# Patient Record
Sex: Male | Born: 1995 | Race: Black or African American | Hispanic: No | Marital: Single | Smoking: Never smoker
Health system: Southern US, Community
[De-identification: ages and names within clinical notes are randomized; demographics above are authoritative.]

## PROBLEM LIST (undated history)

## (undated) DIAGNOSIS — M419 Scoliosis, unspecified: Secondary | ICD-10-CM

## (undated) DIAGNOSIS — G809 Cerebral palsy, unspecified: Secondary | ICD-10-CM

## (undated) HISTORY — PX: BACK SURGERY: SHX140

---
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
## 2015-02-06 ENCOUNTER — Emergency Department (HOSPITAL_COMMUNITY): Payer: 59

## 2015-02-06 ENCOUNTER — Encounter (HOSPITAL_COMMUNITY): Payer: Self-pay | Admitting: Emergency Medicine

## 2015-02-06 ENCOUNTER — Inpatient Hospital Stay (HOSPITAL_COMMUNITY)
Admission: EM | Admit: 2015-02-06 | Discharge: 2015-02-09 | DRG: 689 | Disposition: A | Discharge status: Home / Self Care | Payer: 59 | Attending: Internal Medicine | Admitting: Internal Medicine

## 2015-02-06 DIAGNOSIS — IMO0001 Reserved for inherently not codable concepts without codable children: Secondary | ICD-10-CM

## 2015-02-06 DIAGNOSIS — K5909 Other constipation: Secondary | ICD-10-CM | POA: Diagnosis present

## 2015-02-06 DIAGNOSIS — R109 Unspecified abdominal pain: Secondary | ICD-10-CM | POA: Diagnosis not present

## 2015-02-06 DIAGNOSIS — N390 Urinary tract infection, site not specified: Secondary | ICD-10-CM | POA: Diagnosis not present

## 2015-02-06 DIAGNOSIS — K5900 Constipation, unspecified: Secondary | ICD-10-CM | POA: Diagnosis present

## 2015-02-06 DIAGNOSIS — G809 Cerebral palsy, unspecified: Secondary | ICD-10-CM | POA: Diagnosis not present

## 2015-02-06 DIAGNOSIS — M419 Scoliosis, unspecified: Secondary | ICD-10-CM | POA: Diagnosis not present

## 2015-02-06 DIAGNOSIS — Z993 Dependence on wheelchair: Secondary | ICD-10-CM

## 2015-02-06 DIAGNOSIS — R532 Functional quadriplegia: Secondary | ICD-10-CM | POA: Diagnosis present

## 2015-02-06 HISTORY — DX: Scoliosis, unspecified: M41.9

## 2015-02-06 HISTORY — DX: Cerebral palsy, unspecified: G80.9

## 2015-02-06 LAB — CBC WITH DIFFERENTIAL/PLATELET
Basophils Absolute: 0 10*3/uL (ref 0.0–0.1)
Basophils Relative: 0 %
EOS ABS: 0 10*3/uL (ref 0.0–0.7)
EOS PCT: 0 %
HCT: 45.9 % (ref 39.0–52.0)
HEMOGLOBIN: 15 g/dL (ref 13.0–17.0)
LYMPHS ABS: 1 10*3/uL (ref 0.7–4.0)
LYMPHS PCT: 19 %
MCH: 23.5 pg — AB (ref 26.0–34.0)
MCHC: 32.7 g/dL (ref 30.0–36.0)
MCV: 72.1 fL — AB (ref 78.0–100.0)
MONOS PCT: 7 %
Monocytes Absolute: 0.4 10*3/uL (ref 0.1–1.0)
Neutro Abs: 3.8 10*3/uL (ref 1.7–7.7)
Neutrophils Relative %: 74 %
PLATELETS: 190 10*3/uL (ref 150–400)
RBC: 6.37 MIL/uL — ABNORMAL HIGH (ref 4.22–5.81)
RDW: 14.1 % (ref 11.5–15.5)
WBC: 5.2 10*3/uL (ref 4.0–10.5)

## 2015-02-06 LAB — COMPREHENSIVE METABOLIC PANEL
ALBUMIN: 4.6 g/dL (ref 3.5–5.0)
ALT: 14 U/L — AB (ref 17–63)
AST: 19 U/L (ref 15–41)
Alkaline Phosphatase: 42 U/L (ref 38–126)
Anion gap: 9 (ref 5–15)
BUN: 10 mg/dL (ref 6–20)
CHLORIDE: 103 mmol/L (ref 101–111)
CO2: 29 mmol/L (ref 22–32)
CREATININE: 0.92 mg/dL (ref 0.61–1.24)
Calcium: 9.6 mg/dL (ref 8.9–10.3)
GFR calc Af Amer: >60 mL/min (ref >60)
GFR calc non Af Amer: >60 mL/min (ref >60)
GLUCOSE: 94 mg/dL (ref 65–99)
POTASSIUM: 3.8 mmol/L (ref 3.5–5.1)
SODIUM: 141 mmol/L (ref 135–145)
Total Bilirubin: 0.6 mg/dL (ref 0.3–1.2)
Total Protein: 8 g/dL (ref 6.5–8.1)

## 2015-02-06 LAB — URINALYSIS, ROUTINE W REFLEX MICROSCOPIC
BILIRUBIN URINE: NEGATIVE
GLUCOSE, UA: NEGATIVE mg/dL
Ketones, ur: 15 mg/dL — AB
Leukocytes, UA: NEGATIVE
Nitrite: NEGATIVE
Protein, ur: 30 mg/dL — AB
SPECIFIC GRAVITY, URINE: 1.025 (ref 1.005–1.030)
pH: 7 (ref 5.0–8.0)

## 2015-02-06 LAB — LACTIC ACID, PLASMA
LACTIC ACID, VENOUS: 2.2 mmol/L — AB (ref 0.5–2.0)
LACTIC ACID, VENOUS: 1.4 mmol/L (ref 0.5–2.0)

## 2015-02-06 LAB — URINE MICROSCOPIC-ADD ON

## 2015-02-06 MED ORDER — PIPERACILLIN-TAZOBACTAM 3.375 G IVPB: 3.3750 g | Freq: Three times a day (TID) | INTRAVENOUS | Status: DC

## 2015-02-06 MED ORDER — SODIUM CHLORIDE 0.9 % IV SOLN
INTRAVENOUS | Status: AC
  Administered 2015-02-07: 03:00:00 via INTRAVENOUS

## 2015-02-06 MED ORDER — BACLOFEN 40 MG/20ML IT SOLN
40.0000 mg | INTRATHECAL | Status: DC
Start: 2015-02-06 — End: 2015-02-06

## 2015-02-06 MED ORDER — SODIUM CHLORIDE 0.9 % IV SOLN
INTRAVENOUS | Status: DC
Start: 2015-02-06 — End: 2015-02-06

## 2015-02-06 MED ORDER — VANCOMYCIN HCL IN DEXTROSE 1-5 GM/200ML-% IV SOLN
1000.0000 mg | Freq: Once | INTRAVENOUS | Status: AC
Start: 2015-02-06 — End: 2015-02-06
  Administered 2015-02-06: 1000 mg via INTRAVENOUS
  Filled 2015-02-06: qty 200

## 2015-02-06 MED ORDER — SODIUM CHLORIDE 0.9 % IV SOLN
1000.0000 mL | INTRAVENOUS | Status: DC
  Administered 2015-02-06: 1000 mL via INTRAVENOUS

## 2015-02-06 MED ORDER — SODIUM CHLORIDE 0.9 % IV BOLUS (SEPSIS)
1000.0000 mL | Freq: Once | INTRAVENOUS | Status: AC
  Administered 2015-02-06: 1000 mL via INTRAVENOUS

## 2015-02-06 MED ORDER — PIPERACILLIN-TAZOBACTAM 3.375 G IVPB 30 MIN
3.3750 g | Freq: Once | INTRAVENOUS | Status: AC
Start: 2015-02-06 — End: 2015-02-06
  Administered 2015-02-06: 3.375 g via INTRAVENOUS
  Filled 2015-02-06: qty 50

## 2015-02-06 MED ORDER — HYDROCODONE-ACETAMINOPHEN 5-325 MG PO TABS
1.0000 | ORAL_TABLET | ORAL | Status: DC | PRN
  Administered 2015-02-07: 2 via ORAL
  Filled 2015-02-06: qty 2

## 2015-02-06 MED ORDER — SODIUM CHLORIDE 0.9 % IV BOLUS (SEPSIS)
500.0000 mL | INTRAVENOUS | Status: AC
  Administered 2015-02-06: 500 mL via INTRAVENOUS

## 2015-02-06 MED ORDER — ONDANSETRON HCL 4 MG PO TABS: 4.0000 mg | ORAL_TABLET | Freq: Four times a day (QID) | ORAL | Status: DC | PRN

## 2015-02-06 MED ORDER — SODIUM CHLORIDE 0.9 % IV SOLN
500.0000 mg | Freq: Three times a day (TID) | INTRAVENOUS | Status: DC
  Filled 2015-02-06 (×2): qty 500

## 2015-02-06 MED ORDER — ALUM & MAG HYDROXIDE-SIMETH 200-200-20 MG/5ML PO SUSP: 30.0000 mL | Freq: Four times a day (QID) | ORAL | Status: DC | PRN

## 2015-02-06 MED ORDER — ONDANSETRON HCL 4 MG/2ML IJ SOLN: 4.0000 mg | Freq: Four times a day (QID) | INTRAMUSCULAR | Status: DC | PRN

## 2015-02-06 MED ORDER — CEFTRIAXONE SODIUM 1 G IJ SOLR
1.0000 g | INTRAMUSCULAR | Status: DC
  Administered 2015-02-07 – 2015-02-09 (×3): 1 g via INTRAVENOUS
  Filled 2015-02-06 (×4): qty 10

## 2015-02-06 NOTE — Progress Notes (Signed)
Pharmacy Antibiotic Note  Caleb Sullivan is a 20 y.o. male admitted on 02/06/2015 with sepsis.  Pharmacy has been consulted for Vancomycin and Zosyn dosing.  Plan: Vancomycin 500 IV every 8 hours.  Goal trough 15-20 mcg/mL. Zosyn 3.375g IV q8h (4 hour infusion).  Height:  (160 cm) Weight: 110 lb (49.896 kg) IBW/kg (Calculated) : 56.9  Temp (24hrs), Avg:100.2 F (37.9 C), Min:99.5 F (37.5 C), Max:100.9 F (38.3 C)   Recent Labs Lab 02/06/15 1755  WBC 5.2  CREATININE 0.92  LATICACIDVEN 2.2*    Estimated Creatinine Clearance: 89.6 mL/min (by C-G formula based on Cr of 0.92).    Allergies  Allergen Reactions  . Fish Allergy    Anti-infectives    Start     Dose/Rate Route Frequency Ordered Stop   02/07/15 0600  vancomycin (VANCOCIN) 500 mg in sodium chloride 0.9 % 100 mL IVPB     500 mg 100 mL/hr over 60 Minutes Intravenous Every 8 hours 02/06/15 2129     02/07/15 0400  piperacillin-tazobactam (ZOSYN) IVPB 3.375 g     3.375 g 12.5 mL/hr over 240 Minutes Intravenous Every 8 hours 02/06/15 2128     02/06/15 1645  piperacillin-tazobactam (ZOSYN) IVPB 3.375 g     3.375 g 100 mL/hr over 30 Minutes Intravenous  Once 02/06/15 1633 02/06/15 1948   02/06/15 1645  vancomycin (VANCOCIN) IVPB 1000 mg/200 mL premix     1,000 mg 200 mL/hr over 60 Minutes Intravenous  Once 02/06/15 1633 02/06/15 2113     Thank you for allowing pharmacy to be a part of this patient's care.  Valrie Hart A 02/06/2015 9:30 PM

## 2015-02-06 NOTE — ED Notes (Signed)
Having difficulty starting IV, attempted x3.  Second nurse in to attempt.

## 2015-02-06 NOTE — ED Notes (Signed)
Pt with cerebral palsy, having urinary retention this morning and holding lower abdomen.  Pt unable to communicate verbally.

## 2015-02-06 NOTE — ED Provider Notes (Signed)
CSN: [id number]     Arrival date & time 02/06/15  1606 History   First MD Initiated Contact with Patient 02/06/15 1618     Chief Complaint  Patient presents with  . Urinary Retention     (Consider location/radiation/quality/duration/timing/severity/associated sxs/prior Treatment) HPI Comments: Patient with history of cerebral palsy, wheelchair bound presenting with what the caregiver thinks is abdominal pain since this morning. Guardian states patient was holding his abdomen as he was breathing him. He has a baclofen pump that was placed in September. Denies any fever but is febrile here. States he's been moving his bowels every other day which is normal. No vomiting. Good by mouth intake and urine output. He does not catheterize himself. He is normally on his own. Behavior has been normal. No history of similar problems in the past.   Past Medical History  Diagnosis Date  . Cerebral palsy (HCC)   . Scoliosis    Past Surgical History  Procedure Laterality Date  . Back surgery     History reviewed. No pertinent family history. Social History  Substance Use Topics  . Smoking status: Never Smoker   . Smokeless tobacco: None  . Alcohol Use: No    Review of Systems  Unable to perform ROS: Patient nonverbal      Allergies  Fish allergy  Home Medications   Prior to Admission medications   Medication Sig Start Date End Date Taking? Authorizing Provider  baclofen (LIORESAL) 40 MG/20ML SOLN 40 mg by Intrathecal route See admin instructions.   Yes Historical Provider, MD  glycopyrrolate (ROBINUL) 1 MG tablet 1 mg by Gastric Tube route 3 (three) times daily.    Yes Historical Provider, MD  loratadine (ALAVERT) 10 MG dissolvable tablet 10 mg by Gastric Tube route at bedtime.    Yes Historical Provider, MD  Nebulizers MISC by Does not apply route as needed.   Yes Historical Provider, MD   BP 136/78 mmHg  Pulse 106  Temp(Src) 99.4 F (37.4 C) (Oral)  Resp 16  Ht  (1.6 m)   Wt 93 lb 4.1 oz (42.3 kg)  BMI 16.52 kg/m2  SpO2 99% Physical Exam  Constitutional: He appears well-developed and well-nourished. No distress.  HENT:  Head: Normocephalic and atraumatic.  Mouth/Throat: Oropharynx is clear and moist.  Eyes: Conjunctivae and EOM are normal. Pupils are equal, round, and reactive to light.  Neck: Normal range of motion. Neck supple.  Cardiovascular: Normal rate, regular rhythm and normal heart sounds.   Pulmonary/Chest: Effort normal and breath sounds normal. No respiratory distress.  Abdominal: Soft. There is no tenderness. There is no rebound and no guarding.  Palpable baclofen pump. Abdomen soft, no guarding or rebound  Genitourinary:  Chaperone present. No fecal impaction. Testicles nontender  Musculoskeletal:  Contractures of lower extremities  Neurological:  Nonverbal, does not follow commands    ED Course  Procedures (including critical care time) Labs Review Labs Reviewed  COMPREHENSIVE METABOLIC PANEL - Abnormal; Notable for the following:    ALT 14 (*)    All other components within normal limits  CBC WITH DIFFERENTIAL/PLATELET - Abnormal; Notable for the following:    RBC 6.37 (*)    MCV 72.1 (*)    MCH 23.5 (*)    All other components within normal limits  URINALYSIS, ROUTINE W REFLEX MICROSCOPIC (NOT AT [facility]) - Abnormal; Notable for the following:    Hgb urine dipstick SMALL (*)    Ketones, ur 15 (*)    Protein, ur 30 (*)  All other components within normal limits  LACTIC ACID, PLASMA - Abnormal; Notable for the following:    Lactic Acid, Venous 2.2 (*)    All other components within normal limits  URINE MICROSCOPIC-ADD ON - Abnormal; Notable for the following:    Squamous Epithelial / LPF 0-5 (*)    Bacteria, UA MANY (*)    All other components within normal limits  CULTURE, BLOOD (ROUTINE X 2)  CULTURE, BLOOD (ROUTINE X 2)  URINE CULTURE  LACTIC ACID, PLASMA  BASIC METABOLIC PANEL  CBC    Imaging Review Dg Abd  Acute W/chest  02/06/2015  CLINICAL DATA:  Code sepsis, cerebral palsy, urinary retention, holding lower abdomen, low grade fever EXAM: DG ABDOMEN ACUTE W/ 1V CHEST COMPARISON:  None FINDINGS: Spinal fixation rods thoracic and lumbar spine into sacrum. Normal heart size, mediastinal contours and pulmonary vascularity. Lungs clear. No pleural effusion or pneumothorax. Medication pump RIGHT lower quadrant with intraspinal catheter. Gas and stool throughout mildly distended colon. Small bowel gas pattern normal. Overall bowel gas pattern is nonspecific without definite obstruction, wall thickening or free air. No acute osseous findings. Questionable tiny nonobstructing LEFT renal calculus. IMPRESSION: Clear lungs. Nonspecific bowel gas pattern. Cannot exclude tiny nonobstructing LEFT renal calculus. Electronically Signed   By: Ulyses Southward M.D.   On: 02/06/2015 17:28   Ct Renal Stone Study  02/06/2015  CLINICAL DATA:  Urinary retention and lower abdominal discomfort. Cerebral palsy. Low-grade fever. Possible left renal calculus on CT. EXAM: CT ABDOMEN AND PELVIS WITHOUT CONTRAST TECHNIQUE: Multidetector CT imaging of the abdomen and pelvis was performed following the standard protocol without IV contrast. COMPARISON:  Plain film of earlier today. FINDINGS: Lower chest: Clear lung bases. Normal heart size without pericardial or pleural effusion. Hepatobiliary: Moderate to marked degradation, secondary to combination of extensive posterior spinal hardware and anterior battery pack. Grossly normal liver. Gallbladder not well evaluated. Pancreas: Pancreas not well evaluated. Spleen: Grossly normal spleen. Adrenals/Urinary Tract: Right adrenal gland grossly normal. Left adrenal gland not well visualized. No dominant renal calculi or hydronephrosis. Ureters difficult to follow. No suspicious pelvic calcifications identified. The bladder is moderately distended, without cause identified. Stomach/Bowel: Grossly normal  stomach. Large amount of stool and gas throughout the colon. The appendix is likely identified on image 50/series 2 and is normal. Small bowel not well evaluated. Vascular/Lymphatic: Abdominal aortic grossly normal in caliber. Limited evaluation for abdominal adenopathy. No pelvic sidewall adenopathy. Reproductive: Normal prostate. Other: No significant free fluid. Musculoskeletal: Thoracolumbar spine fixation, without acute hardware complication. IMPRESSION: 1. Moderate to severely degraded exam, secondary to beam hardening artifact from spinal hardware and spinal stimulator pump/battery. 2. No gross urinary tract calculi or hydronephrosis. 3. Moderate bladder distention, of indeterminate etiology. 4. Large amount of colonic stool, suggesting constipation. Electronically Signed   By: Jeronimo Greaves M.D.   On: 02/06/2015 18:48   I have personally reviewed and evaluated these images and lab results as part of my medical decision-making.   EKG Interpretation None      MDM   Final diagnoses:  Abdominal pain  Urinary tract infection without hematuria, site unspecified   Cerebral palsy patient with apparent abdominal pain. He is febrile on arrival. Abdomen is soft with baclofen pump. There is no fecal impaction. Code sepsis given his tachycardia and fever.  AAS without obstruction, possible L renal calculus. No fecal impaction. Lactate 2.2.  WBC normal.    IVF and IV antibiotics per sepsis protocol. UA appears to be dirty.  CT  degraded by artifact.  Distended bladder and constipation.  Patient able to urinate.  Hold foley at this time.  Plan admission for IV antibiotics given His medical history.  D/w Dr. Onalee Hua.   Angiocath insertion Performed by: Glynn Octave  Consent: Verbal consent obtained. Risks and benefits: risks, benefits and alternatives were discussed Time out: Immediately prior to procedure a "time out" was called to verify the correct patient, procedure, equipment, support  staff and site/side marked as required.  Preparation: Patient was prepped and draped in the usual sterile fashion.  Vein Location: L forearm  Yes Ultrasound Guided  Gauge: 20  Normal blood return and flush without difficulty Patient tolerance: Patient tolerated the procedure well with no immediate complications.     Glynn Octave, MD 02/07/15 [phone]

## 2015-02-06 NOTE — H&P (Signed)
PCP:   No primary care provider on file.   Chief Complaint:  Acts like he is in pain  HPI: 20 yo male h/o cerebral palsy wheel chair bound, nonverbal overall happy usually brought in by uncle and grandparents who take care of him because today he was acting like he was in pain and holding his lower abdomen.  He has not had any n/v/d.  No fevers at home.  Has been eating well.  He can bare weight to transfer but today he didn't want to do that as if it hurt and he would hold his lower belly/private area.  They have not noticed any bloody urine, no foul smell to urine.  He is not frequently hosptitalized and not on antibiotics a lot.  Found to have a uti.    Review of Systems:  Positive and negative as per HPI otherwise all other systems are negative per family  Past Medical History: Past Medical History  Diagnosis Date  . Cerebral palsy (HCC)   . Scoliosis    Past Surgical History  Procedure Laterality Date  . Back surgery      Medications: Prior to Admission medications   Medication Sig Start Date End Date Taking? Authorizing Provider  baclofen (LIORESAL) 40 MG/20ML SOLN 40 mg by Intrathecal route See admin instructions.   Yes Historical Provider, MD  glycopyrrolate (ROBINUL) 1 MG tablet 1 mg by Gastric Tube route 3 (three) times daily.    Yes Historical Provider, MD  loratadine (ALAVERT) 10 MG dissolvable tablet 10 mg by Gastric Tube route at bedtime.    Yes Historical Provider, MD  Nebulizers MISC by Does not apply route as needed.   Yes Historical Provider, MD    Allergies:   Allergies  Allergen Reactions  . Fish Allergy     Social History:  reports that he has never smoked. He does not have any smokeless tobacco history on file. He reports that he does not drink alcohol or use illicit drugs.  Family History: No premature CAD  Physical Exam: Filed Vitals:   02/06/15 2011 02/06/15 2030 02/06/15 2130 02/06/15 2221  BP: 135/87 129/83 136/88 136/78  Pulse: 107 104  101 106  Temp:    99.4 F (37.4 C)  TempSrc:    Oral  Resp: Height:      Weight:    42.3 kg (93 lb 4.1 oz)  SpO2: 98% 97% 100% 99%   General appearance: alert, cooperative and no distress Head: Normocephalic, without obvious abnormality, atraumatic Nose: Nares normal. Septum midline. Mucosa normal. No drainage or sinus tenderness. Neck: no JVD and supple, symmetrical, trachea midline Lungs: clear to auscultation bilaterally Heart: regular rate and rhythm, S1, S2 normal, no murmur, click, rub or gallop Abdomen: soft, non-tender; bowel sounds normal; no masses,  no organomegaly Male genitalia: normal Extremities: extremities normal, atraumatic, no cyanosis or edema  Atrophy to ble Pulses: 2+ and symmetric Skin: Skin color, texture, turgor normal. No rashes or lesions Neurologic: Mental status: alertness: alert Cranial nerves: normal   Labs on Admission:   Recent Labs  02/06/15 1755  NA 141  K 3.8  CL 103  CO2 29  GLUCOSE 94  BUN 10  CREATININE 0.92  CALCIUM 9.6    Recent Labs  02/06/15 1755  AST 19  ALT 14*  ALKPHOS 42  BILITOT 0.6  PROT 8.0  ALBUMIN 4.6     Recent Labs  02/06/15 1755  WBC 5.2  NEUTROABS 3.8  HGB  15.0  HCT 45.9  MCV 72.1*  PLT 190    Radiological Exams on Admission: Dg Abd Acute W/chest  02/06/2015  CLINICAL DATA:  Code sepsis, cerebral palsy, urinary retention, holding lower abdomen, low grade fever EXAM: DG ABDOMEN ACUTE W/ 1V CHEST COMPARISON:  None FINDINGS: Spinal fixation rods thoracic and lumbar spine into sacrum. Normal heart size, mediastinal contours and pulmonary vascularity. Lungs clear. No pleural effusion or pneumothorax. Medication pump RIGHT lower quadrant with intraspinal catheter. Gas and stool throughout mildly distended colon. Small bowel gas pattern normal. Overall bowel gas pattern is nonspecific without definite obstruction, wall thickening or free air. No acute osseous findings. Questionable tiny  nonobstructing LEFT renal calculus. IMPRESSION: Clear lungs. Nonspecific bowel gas pattern. Cannot exclude tiny nonobstructing LEFT renal calculus. Electronically Signed   By: Ulyses Southward M.D.   On: 02/06/2015 17:28   Ct Renal Stone Study  02/06/2015  CLINICAL DATA:  Urinary retention and lower abdominal discomfort. Cerebral palsy. Low-grade fever. Possible left renal calculus on CT. EXAM: CT ABDOMEN AND PELVIS WITHOUT CONTRAST TECHNIQUE: Multidetector CT imaging of the abdomen and pelvis was performed following the standard protocol without IV contrast. COMPARISON:  Plain film of earlier today. FINDINGS: Lower chest: Clear lung bases. Normal heart size without pericardial or pleural effusion. Hepatobiliary: Moderate to marked degradation, secondary to combination of extensive posterior spinal hardware and anterior battery pack. Grossly normal liver. Gallbladder not well evaluated. Pancreas: Pancreas not well evaluated. Spleen: Grossly normal spleen. Adrenals/Urinary Tract: Right adrenal gland grossly normal. Left adrenal gland not well visualized. No dominant renal calculi or hydronephrosis. Ureters difficult to follow. No suspicious pelvic calcifications identified. The bladder is moderately distended, without cause identified. Stomach/Bowel: Grossly normal stomach. Large amount of stool and gas throughout the colon. The appendix is likely identified on image 50/series 2 and is normal. Small bowel not well evaluated. Vascular/Lymphatic: Abdominal aortic grossly normal in caliber. Limited evaluation for abdominal adenopathy. No pelvic sidewall adenopathy. Reproductive: Normal prostate. Other: No significant free fluid. Musculoskeletal: Thoracolumbar spine fixation, without acute hardware complication. IMPRESSION: 1. Moderate to severely degraded exam, secondary to beam hardening artifact from spinal hardware and spinal stimulator pump/battery. 2. No gross urinary tract calculi or hydronephrosis. 3. Moderate  bladder distention, of indeterminate etiology. 4. Large amount of colonic stool, suggesting constipation. Electronically Signed   By: Jeronimo Greaves M.D.   On: 02/06/2015 18:48    Assessment/Plan  20 yo male with cerebral palsy and uti  Principal Problem:   Urinary tract infection-  Place on iv rocephin.  Urine cx sent.  Unclear if has stone on ct scan due to metal for prev scoliosis surgeries.  Has good uop.  Follow clinically.  Obtain renal ultrasound.  Active Problems:   Cerebral palsy (HCC)   Scoliosis  obs on medical.  Full code.  Family updated at bedside.  Caleb Sullivan A 02/06/2015, 11:37 PM

## 2015-02-06 NOTE — Progress Notes (Signed)
ANTIBIOTIC CONSULT NOTE-Preliminary  Pharmacy Consult for Vancomycin and Zosyn Indication: sepsis  Allergies  Allergen Reactions  . Fish Allergy    Patient Measurements: Height:  (160 cm) Weight: 110 lb (49.896 kg) IBW/kg (Calculated) : 56.9  Vital Signs: Temp: 100.9 F (38.3 C) (01/30 1610) Temp Source: Tympanic (01/30 1610) BP: 149/106 mmHg (01/30 1610) Pulse Rate: 119 (01/30 1610)  Labs: No results for input(s): WBC, HGB, PLT, LABCREA, CREATININE in the last 72 hours.  CrCl cannot be calculated (Patient has no serum creatinine result on file.).  No results for input(s): VANCOTROUGH, VANCOPEAK, VANCORANDOM, GENTTROUGH, GENTPEAK, GENTRANDOM, TOBRATROUGH, TOBRAPEAK, TOBRARND, AMIKACINPEAK, AMIKACINTROU, AMIKACIN in the last 72 hours.   Microbiology: No results found for this or any previous visit (from the past 720 hour(s)).  Medical History: Past Medical History  Diagnosis Date  . Cerebral palsy (HCC)   . Scoliosis    Anti-infectives    Start     Dose/Rate Route Frequency Ordered Stop   02/06/15 1645  piperacillin-tazobactam (ZOSYN) IVPB 3.375 g     3.375 g 100 mL/hr over 30 Minutes Intravenous  Once 02/06/15 1633     02/06/15 1645  vancomycin (VANCOCIN) IVPB 1000 mg/200 mL premix     1,000 mg 200 mL/hr over 60 Minutes Intravenous  Once 02/06/15 1633       Assessment: 20yo male with h/o cerebral palsy.  Labs pending.    Goal of Therapy:  Vancomycin trough level 15-20 mcg/ml  Plan:  Preliminary review of pertinent patient information completed.  Protocol will be initiated with a one-time dose(s) of Vancomycin  and Zosyn 3.375gm.  Jeani Hawking clinical pharmacist will complete review after admission to assess patient and finalize treatment regimen.  Valrie Hart A, RPH 02/06/2015,4:54 PM

## 2015-02-06 NOTE — ED Notes (Signed)
MD at bedside. 

## 2015-02-06 NOTE — ED Notes (Signed)
CRITICAL VALUE ALERT  Critical value received:  Lactic acid  Date of notification:  02/06/15  Time of notification:  1843  Critical value read back:Yes.    Nurse who received alert:  Dorris Fetch, RN  MD notified (1st page):  Dr Manus Gunning  Time of first page:  [phone]

## 2015-02-06 NOTE — ED Notes (Signed)
Only able to obtain one IV site, MD aware.

## 2015-02-06 NOTE — ED Notes (Signed)
Pt very difficult stick.  Blood cultures drawn x2.

## 2015-02-07 ENCOUNTER — Observation Stay (HOSPITAL_COMMUNITY): Payer: 59

## 2015-02-07 DIAGNOSIS — N3000 Acute cystitis without hematuria: Secondary | ICD-10-CM | POA: Diagnosis not present

## 2015-02-07 DIAGNOSIS — K5900 Constipation, unspecified: Secondary | ICD-10-CM | POA: Diagnosis not present

## 2015-02-07 DIAGNOSIS — G800 Spastic quadriplegic cerebral palsy: Secondary | ICD-10-CM | POA: Diagnosis not present

## 2015-02-07 DIAGNOSIS — Z993 Dependence on wheelchair: Secondary | ICD-10-CM | POA: Diagnosis not present

## 2015-02-07 DIAGNOSIS — R109 Unspecified abdominal pain: Secondary | ICD-10-CM | POA: Diagnosis present

## 2015-02-07 DIAGNOSIS — M419 Scoliosis, unspecified: Secondary | ICD-10-CM | POA: Diagnosis present

## 2015-02-07 DIAGNOSIS — R532 Functional quadriplegia: Secondary | ICD-10-CM | POA: Diagnosis present

## 2015-02-07 DIAGNOSIS — G809 Cerebral palsy, unspecified: Secondary | ICD-10-CM | POA: Diagnosis present

## 2015-02-07 DIAGNOSIS — K5909 Other constipation: Secondary | ICD-10-CM | POA: Diagnosis present

## 2015-02-07 DIAGNOSIS — N390 Urinary tract infection, site not specified: Secondary | ICD-10-CM | POA: Diagnosis present

## 2015-02-07 LAB — BASIC METABOLIC PANEL
Anion gap: 11 (ref 5–15)
BUN: 6 mg/dL (ref 6–20)
CHLORIDE: 102 mmol/L (ref 101–111)
CO2: 24 mmol/L (ref 22–32)
CREATININE: 0.84 mg/dL (ref 0.61–1.24)
Calcium: 9.2 mg/dL (ref 8.9–10.3)
GFR calc Af Amer: >60 mL/min (ref >60)
GFR calc non Af Amer: >60 mL/min (ref >60)
Glucose, Bld: 101 mg/dL — ABNORMAL HIGH (ref 65–99)
Potassium: 4 mmol/L (ref 3.5–5.1)
SODIUM: 137 mmol/L (ref 135–145)

## 2015-02-07 LAB — CBC
HEMATOCRIT: 44.4 % (ref 39.0–52.0)
HEMOGLOBIN: 14.5 g/dL (ref 13.0–17.0)
MCH: 23.4 pg — AB (ref 26.0–34.0)
MCHC: 32.7 g/dL (ref 30.0–36.0)
MCV: 71.7 fL — AB (ref 78.0–100.0)
Platelets: 181 10*3/uL (ref 150–400)
RBC: 6.19 MIL/uL — AB (ref 4.22–5.81)
RDW: 14.1 % (ref 11.5–15.5)
WBC: 6.2 10*3/uL (ref 4.0–10.5)

## 2015-02-07 MED ORDER — BISACODYL 10 MG RE SUPP
10.0000 mg | Freq: Once | RECTAL | Status: AC
  Administered 2015-02-07: 10 mg via RECTAL
  Filled 2015-02-07: qty 1

## 2015-02-07 MED ORDER — CEFTRIAXONE SODIUM 1 G IJ SOLR
INTRAMUSCULAR | Status: AC
  Filled 2015-02-07: qty 10

## 2015-02-07 MED ORDER — ENOXAPARIN SODIUM 40 MG/0.4ML ~~LOC~~ SOLN
40.0000 mg | SUBCUTANEOUS | Status: DC
  Administered 2015-02-07: 40 mg via SUBCUTANEOUS
  Filled 2015-02-07: qty 0.4

## 2015-02-07 MED ORDER — SENNOSIDES-DOCUSATE SODIUM 8.6-50 MG PO TABS
1.0000 | ORAL_TABLET | Freq: Two times a day (BID) | ORAL | Status: DC
  Administered 2015-02-07 – 2015-02-09 (×4): 1 via ORAL
  Filled 2015-02-07 (×5): qty 1

## 2015-02-07 MED ORDER — POLYETHYLENE GLYCOL 3350 17 G PO PACK
17.0000 g | PACK | Freq: Two times a day (BID) | ORAL | Status: DC
  Administered 2015-02-07 – 2015-02-09 (×4): 17 g via ORAL
  Filled 2015-02-07 (×5): qty 1

## 2015-02-07 MED ORDER — FLEET ENEMA 7-19 GM/118ML RE ENEM
1.0000 | ENEMA | Freq: Once | RECTAL | Status: AC
  Administered 2015-02-07: 1 via RECTAL

## 2015-02-07 MED ORDER — ENOXAPARIN SODIUM 30 MG/0.3ML ~~LOC~~ SOLN
30.0000 mg | SUBCUTANEOUS | Status: DC
  Filled 2015-02-07: qty 0.3

## 2015-02-07 NOTE — Plan of Care (Signed)
Problem: Education: Goal: Knowledge of treatment and prevention of UTI/Pyleonephritis will improve Outcome: Progressing Pt's family spoke with MD about diagnosis. MD told pt's family the plan for care, and pt's family verbalized understanding.   Problem: Education: Goal: Knowledge of [facility] Education information/materials will improve Outcome: Progressing Pt unable to understand. Explained care to family who verbalized understanding.   Problem: Pain Managment: Goal: General experience of comfort will improve Outcome: Progressing Pt stated he was hurting and pointed to the right leg. Pt given pain medication.  Pt asleep after reassessing.   Problem: Physical Regulation: Goal: Ability to maintain clinical measurements within normal limits will improve Outcome: Progressing See flowsheet and lab results. Goal: Will remain free from infection Outcome: Not Progressing Dx UTI.

## 2015-02-07 NOTE — Progress Notes (Signed)
[facility] PROGRESS NOTE  Caleb Sullivan ZOX:[id number] DOB: 04-Sep-1995 DOA: 02/06/2015 PCP: No primary care provider on file.  Assessment/Plan: 1. Abd pain/discomfort -due to possible UTI, although UA not convincing and chronic constipation -improving and at baseline per family  2. Possible UTi -FU Urine Cx, continue ceftriaxone -initially there was a concern for urinary retention, but able to void without difficulty at this time  3. ACute on chronic Constipation -per Uncle he gets Enemas almost every other day, no results with enema last Pm -CT with large stool burden in colon -add senokot/miralax, give suppository this am and enema if no results by afternoon  4. Cerebral palsy/Wheel chair bound -total care, cared for at home by uncle and aunt  Code Status: Full Code Family Communication: grandmother and uncle at bedside Disposition Plan: home likely tomorrow  HPI/Subjective: Looks better, no distress, had enema last Pm without results per uncle  Objective: Filed Vitals:   02/06/15 2130 02/06/15 2221  BP: 136/88 136/78  Pulse: 101 106  Temp:  99.4 F (37.4 C)  Resp: 17 16    Intake/Output Summary (Last 24 hours) at 02/07/15 1123 Last data filed at 02/07/15 0700  Gross per 24 hour  Intake  662.5 ml  Output    864 ml  Net -201.5 ml   Filed Weights   02/06/15 1610 02/06/15 2221  Weight: 49.896 kg (110 lb) 42.3 kg (93 lb 4.1 oz)    Exam:   General:  Smiling, mumbling, pleasant, no distress  Cardiovascular: S1S2/RRR  Respiratory: CTAB  Abdomen: soft, NT, BS present  Musculoskeletal: contractures of both lower ext  Data Reviewed: Basic Metabolic Panel:  Recent Labs Lab 02/06/15 1755 02/07/15 0758  NA 141 137  K 3.8 4.0  CL 103 102  CO2 29 24  GLUCOSE 94 101*  BUN 10 6  CREATININE 0.92 0.84  CALCIUM 9.6 9.2   Liver Function Tests:  Recent Labs Lab 02/06/15 1755  AST 19  ALT 14*  ALKPHOS 42  BILITOT 0.6  PROT 8.0  ALBUMIN 4.6    No results for input(s): LIPASE, AMYLASE in the last 168 hours. No results for input(s): AMMONIA in the last 168 hours. CBC:  Recent Labs Lab 02/06/15 1755 02/07/15 0758  WBC 5.2 6.2  NEUTROABS 3.8  --   HGB 15.0 14.5  HCT 45.9 44.4  MCV 72.1* 71.7*  PLT 190 181   Cardiac Enzymes: No results for input(s): CKTOTAL, CKMB, CKMBINDEX, TROPONINI in the last 168 hours. BNP (last 3 results) No results for input(s): BNP in the last 8760 hours.  ProBNP (last 3 results) No results for input(s): PROBNP in the last 8760 hours.  CBG: No results for input(s): GLUCAP in the last 168 hours.  Recent Results (from the past 240 hour(s))  Blood Culture (routine x 2)     Status: None (Preliminary result)   Collection Time: 02/06/15  5:05 PM  Result Value Ref Range Status   Specimen Description BLOOD LEFT HAND  Final   Special Requests BOTTLES DRAWN AEROBIC ONLY 4CC  Final   Culture NO GROWTH < 24 HOURS  Final   Report Status PENDING  Incomplete  Blood Culture (routine x 2)     Status: None (Preliminary result)   Collection Time: 02/06/15  5:30 PM  Result Value Ref Range Status   Specimen Description BLOOD LEFT ANTECUBITAL DRAWN BY RN  Final   Special Requests   Final    BOTTLES DRAWN AEROBIC AND ANAEROBIC AEB=10CC ANA=6CC  Culture NO GROWTH < 24 HOURS  Final   Report Status PENDING  Incomplete     Studies: Dg Abd Acute W/chest  02/06/2015  CLINICAL DATA:  Code sepsis, cerebral palsy, urinary retention, holding lower abdomen, low grade fever EXAM: DG ABDOMEN ACUTE W/ 1V CHEST COMPARISON:  None FINDINGS: Spinal fixation rods thoracic and lumbar spine into sacrum. Normal heart size, mediastinal contours and pulmonary vascularity. Lungs clear. No pleural effusion or pneumothorax. Medication pump RIGHT lower quadrant with intraspinal catheter. Gas and stool throughout mildly distended colon. Small bowel gas pattern normal. Overall bowel gas pattern is nonspecific without definite  obstruction, wall thickening or free air. No acute osseous findings. Questionable tiny nonobstructing LEFT renal calculus. IMPRESSION: Clear lungs. Nonspecific bowel gas pattern. Cannot exclude tiny nonobstructing LEFT renal calculus. Electronically Signed   By: Ulyses Southward M.D.   On: 02/06/2015 17:28   Ct Renal Stone Study  02/06/2015  CLINICAL DATA:  Urinary retention and lower abdominal discomfort. Cerebral palsy. Low-grade fever. Possible left renal calculus on CT. EXAM: CT ABDOMEN AND PELVIS WITHOUT CONTRAST TECHNIQUE: Multidetector CT imaging of the abdomen and pelvis was performed following the standard protocol without IV contrast. COMPARISON:  Plain film of earlier today. FINDINGS: Lower chest: Clear lung bases. Normal heart size without pericardial or pleural effusion. Hepatobiliary: Moderate to marked degradation, secondary to combination of extensive posterior spinal hardware and anterior battery pack. Grossly normal liver. Gallbladder not well evaluated. Pancreas: Pancreas not well evaluated. Spleen: Grossly normal spleen. Adrenals/Urinary Tract: Right adrenal gland grossly normal. Left adrenal gland not well visualized. No dominant renal calculi or hydronephrosis. Ureters difficult to follow. No suspicious pelvic calcifications identified. The bladder is moderately distended, without cause identified. Stomach/Bowel: Grossly normal stomach. Large amount of stool and gas throughout the colon. The appendix is likely identified on image 50/series 2 and is normal. Small bowel not well evaluated. Vascular/Lymphatic: Abdominal aortic grossly normal in caliber. Limited evaluation for abdominal adenopathy. No pelvic sidewall adenopathy. Reproductive: Normal prostate. Other: No significant free fluid. Musculoskeletal: Thoracolumbar spine fixation, without acute hardware complication. IMPRESSION: 1. Moderate to severely degraded exam, secondary to beam hardening artifact from spinal hardware and spinal  stimulator pump/battery. 2. No gross urinary tract calculi or hydronephrosis. 3. Moderate bladder distention, of indeterminate etiology. 4. Large amount of colonic stool, suggesting constipation. Electronically Signed   By: Jeronimo Greaves M.D.   On: 02/06/2015 18:48    Scheduled Meds: . cefTRIAXone (ROCEPHIN)  IV  1 g Intravenous Q24H  . polyethylene glycol  17 g Oral BID  . senna-docusate  1 tablet Oral BID  . sodium phosphate  1 enema Rectal Once   Continuous Infusions:  Antibiotics Given (last 72 hours)    Date/Time Action Medication Dose Rate   02/07/15 0024 Given   cefTRIAXone (ROCEPHIN) 1 g in dextrose 5 % 50 mL IVPB 1 g 100 mL/hr      Principal Problem:   Urinary tract infection Active Problems:   Cerebral palsy (HCC)   Scoliosis    Time spent:79min    [facility]  [facility] Pager [phone]. If 7PM-7AM, please contact night-coverage at [email or web address], password [email or web address] 02/07/2015, 11:23 AM

## 2015-02-08 DIAGNOSIS — N3000 Acute cystitis without hematuria: Secondary | ICD-10-CM

## 2015-02-08 DIAGNOSIS — G800 Spastic quadriplegic cerebral palsy: Secondary | ICD-10-CM

## 2015-02-08 DIAGNOSIS — K5900 Constipation, unspecified: Secondary | ICD-10-CM

## 2015-02-08 DIAGNOSIS — R532 Functional quadriplegia: Secondary | ICD-10-CM | POA: Diagnosis present

## 2015-02-08 LAB — CBC
HCT: 47.9 % (ref 39.0–52.0)
HEMOGLOBIN: 15.4 g/dL (ref 13.0–17.0)
MCH: 23.3 pg — AB (ref 26.0–34.0)
MCHC: 32.2 g/dL (ref 30.0–36.0)
MCV: 72.5 fL — ABNORMAL LOW (ref 78.0–100.0)
PLATELETS: 207 10*3/uL (ref 150–400)
RBC: 6.61 MIL/uL — ABNORMAL HIGH (ref 4.22–5.81)
RDW: 14.2 % (ref 11.5–15.5)
WBC: 4.8 10*3/uL (ref 4.0–10.5)

## 2015-02-08 LAB — BASIC METABOLIC PANEL
Anion gap: 9 (ref 5–15)
BUN: 8 mg/dL (ref 6–20)
CHLORIDE: 104 mmol/L (ref 101–111)
CO2: 28 mmol/L (ref 22–32)
CREATININE: 0.89 mg/dL (ref 0.61–1.24)
Calcium: 9.5 mg/dL (ref 8.9–10.3)
GFR calc Af Amer: >60 mL/min (ref >60)
GFR calc non Af Amer: >60 mL/min (ref >60)
GLUCOSE: 94 mg/dL (ref 65–99)
Potassium: 4.5 mmol/L (ref 3.5–5.1)
Sodium: 141 mmol/L (ref 135–145)

## 2015-02-08 LAB — URINE CULTURE: Culture: NO GROWTH

## 2015-02-08 MED ORDER — CEFUROXIME AXETIL 500 MG PO TABS: 500.0000 mg | ORAL_TABLET | Freq: Two times a day (BID) | ORAL | Status: AC

## 2015-02-08 MED ORDER — MAGNESIUM HYDROXIDE 400 MG/5ML PO SUSP
960.0000 mL | Freq: Once | ORAL | Status: DC
  Filled 2015-02-08: qty 240

## 2015-02-08 MED ORDER — MILK AND MOLASSES ENEMA
1.0000 | Freq: Once | RECTAL | Status: AC
  Administered 2015-02-08: 250 mL via RECTAL

## 2015-02-08 MED ORDER — FLEET ENEMA 7-19 GM/118ML RE ENEM: 1.0000 | ENEMA | Freq: Every day | RECTAL | Status: AC | PRN

## 2015-02-08 NOTE — Clinical Documentation Improvement (Signed)
Hospitalist  (Query responses must be documented in the current medical record, not on the CDI BPA form.)  Query 1 of 2  Possible Clinical Conditions:  - Functional Quadriplegia secondary to Cerebral Palsy  - Other condition  - Unable to clinically determine  Clinical Indicators/Information: "Cerebral palsy/Wheel chair bound  -total care, cared for at home by uncle and aunt" is documented by Dr. Jomarie Longs 02/07/15 at 11:33 am  Query 2 of 2 Contractures is documented in the current medical record.  Please document the specific joints involved with contractures, including laterality.   Clinical Indicators/Information: "Contractures of lower extremities" documented by ED provider, Dr. Manus Gunning, 02/07/15  "Musculoskeletal: contractures of both lower ext" documented by Dr. Jomarie Longs 02/07/15     Please exercise your independent, professional judgment when responding. A specific answer is not anticipated or expected.   Thank You, Jerral Ralph  RN BSN CCDS [phone] Health Information Management [place]

## 2015-02-08 NOTE — Progress Notes (Signed)
Patient has had no results yet from enema.  Dr. Vanessa Barbara notified via text page.

## 2015-02-08 NOTE — Progress Notes (Signed)
No results from enema this morning.  Patient has not voided since foley removed at 1105.  Dr. Vanessa Barbara notified via text page.  Dr. Vanessa Barbara returned page and gave order to administer the SMOG enema.  Bladder scan showed >339 ml.  Dr. Vanessa Barbara notified and gave order to in and out cath patient.  Bladder scan again 4-6 hours.  If residual is greater than 250 ml, place foley catheter.

## 2015-02-08 NOTE — Progress Notes (Signed)
Follow-up note  I was notified by our and that patient has still not had a bowel movement despite administration of note and molasses enema this morning. Also he continues to have urinary retention after removal of Foley catheter with bladder scan showing 340 mL. instructed RN to administer SMOG enema and perform in and out catheter with repeat bladder scan. Discharge will need to be discontinued

## 2015-02-08 NOTE — Care Management Note (Signed)
Case Management Note  Patient Details  Name: Azariah Bonura MRN: [id number] Date of Birth: [birth year]  Subjective/Objective:                  Pt has cerebal palsy. Pt is wheelchair bound and requires total care. Pt lives with great-aunt and uncle who have gardianship over pt and provide 24/7 care. Uncle in room with pt at this time and reports pt has no DME or HH needs post-DC. Pt discharging home today.   Action/Plan:  No CM needs.  Expected Discharge Date:      02/08/2015            Expected Discharge Plan:  Home/Self Care  In-House Referral:  NA  Discharge planning Services  CM Consult  Post Acute Care Choice:  NA Choice offered to:  NA  DME Arranged:    DME Agency:     HH Arranged:    HH Agency:     Status of Service:  Completed, signed off  Medicare Important Message Given:    Date Medicare IM Given:    Medicare IM give by:    Date Additional Medicare IM Given:    Additional Medicare Important Message give by:     If discussed at Long Length of Stay Meetings, dates discussed:    Additional Comments:  Malcolm Metro, RN 02/08/2015, 11:45 AM

## 2015-02-08 NOTE — Progress Notes (Signed)
SMOG enema given.  Patient started to have some results.  Will continue to monitor.

## 2015-02-08 NOTE — Discharge Summary (Addendum)
Physician Discharge Summary  Caleb Sullivan AVW:[id number] DOB: [birth year] DOA: 02/06/2015  PCP: No primary care provider on file.  Admit date: 02/06/2015 Discharge date: 02/09/2015  Time spent: 35 minutes  Recommendations for Outpatient Follow-up:  1. Patient was admitted and treated for urinary tract infection discharged on Ceftin, please follow-up on urine cultures as they were pending at the time of discharge.  2. 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  Discharge Condition: Stable  Diet recommendation: Regular diet  Filed Weights   02/06/15 1610 02/06/15 2221  Weight: 49.896 kg (110 lb) 42.3 kg (93 lb 4.1 oz)    History of present illness:  20 yo male h/o cerebral palsy wheel chair bound, nonverbal overall happy usually brought in by uncle and grandparents who take care of him because today he was acting like he was in pain and holding his lower abdomen. He has not had any n/v/d. No fevers at home. Has been eating well. He can bare weight to transfer but today he didn't want to do that as if it hurt and he would hold his lower belly/private area. They have not noticed any bloody urine, no foul smell to urine. He is not frequently hosptitalized and not on antibiotics a lot. Found to have a uti.   Hospital Course:  The child liver is a pleasant 65 year old gentleman with a past medical history of cerebral palsy at baseline is nonverbal and wheelchair bound, presented with complaints of abdominal pain. CT scan of  abdomen revealed large amount of colonic stool consistent with constipation. He was also found to have moderate bladder distention. It was felt that constipation was causing abdominal symptoms and likely contributed to urinary retention. Foley catheter was placed. During this hospitalization he was started on antibiotic therapy for presumed urinary tract infection. He showed gradual clinical improvement and by 02/08/2015 had returned to his baseline. At the time of discharge urine cultures were pending. He had been treated with ceftriaxone during this hospitalization and discharged on Ceftin therapy. Blood cultures remained sterile. He was discharged home on 02/08/2015.  Addendum  Patient was kept overnight since he had not had a bowel movement  despite the administration of a milk and molasses enema yesterday morning. In the evening he was given a SMOG enema that produced some results. This morning his caregiver reporting that Caleb Sullivan is at his baseline and feels that he is ready to go home. She feels that he would do much better at home getting back to his normal routine. With regard to urinary retention he had one in out catheterization last night with repeat bladder scan that showed residual of 150 mL. On physical examination he is calm, cooperative, pleasant, smiling. On abdominal examination, was nontender to palpation without evidence for distention. Plan to discharge him to his home this morning.  Discharge Exam: Filed Vitals:   02/07/15 2235 02/08/15 0530  BP: 121/84 138/88  Pulse: 72 77  Temp: 98.3 F (36.8 C) 98.6 F (37 C)  Resp: 20 20    General: He is calm, cooperative, in no acute distress. Family members reporting he is functioning  at his baseline Cardiovascular: Regular rate and rhythm normal S1-S2 Respiratory: Normal respiratory effort Abdomen: Soft nontender nondistended Extremities: Positive contractures 2 upper and lower extremities  Discharge Instructions   Discharge  Instructions    Call MD for:  difficulty breathing, headache or visual disturbances    Complete by:  As directed      Call MD for:  difficulty breathing, headache or visual disturbances    Complete by:  As directed      Call MD for:  extreme fatigue    Complete by:  As directed      Call MD for:  extreme fatigue    Complete by:  As directed      Call MD for:  hives    Complete by:  As directed      Call MD for:  hives    Complete by:  As directed      Call MD for:  persistant dizziness or light-headedness    Complete by:  As directed      Call MD for:  persistant dizziness or light-headedness    Complete by:  As directed      Call MD for:  persistant nausea and vomiting    Complete by:  As directed      Call MD for:  persistant nausea and vomiting    Complete by:  As directed      Call MD for:  redness, tenderness, or signs of infection (pain, swelling, redness, odor or green/yellow discharge around incision site)    Complete by:  As directed      Call MD for:  redness, tenderness, or signs of infection (pain, swelling, redness, odor or green/yellow discharge around incision site)    Complete by:  As directed      Call MD for:  severe uncontrolled pain    Complete by:  As directed      Call MD for:  severe uncontrolled pain    Complete by:  As directed      Call MD for:  temperature >100.4    Complete by:  As directed      Call MD for:  temperature >100.4    Complete by:  As directed      Call MD for:    Complete by:  As directed      Call MD for:    Complete by:  As directed      Diet - low sodium heart healthy    Complete by:  As directed      Diet - low sodium heart healthy    Complete by:  As directed      Increase activity slowly    Complete by:  As directed      Increase activity slowly    Complete by:  As directed           Current Discharge Medication List    START taking these medications   Details  cefUROXime (CEFTIN) 500 MG tablet Take 1 tablet (500 mg  total) by mouth 2 (two) times daily with a meal. Qty: 10 tablet, Refills: 0    sodium phosphate (FLEET) 7-19 GM/118ML ENEM Place 133 mLs (1 enema total) rectally daily as needed for severe constipation. Qty: 5 Bottle, Refills: 0      CONTINUE these medications which have NOT CHANGED   Details  baclofen (LIORESAL) 40 MG/20ML SOLN 135 mcg by Intrathecal route continuous. Patient's pump is filled with 80,000 mcg total.  Patient's rate is 135  mcg/24 hr.  Patient has an appointment to refill bacoflen in April 2017.    glycopyrrolate (ROBINUL) 1 MG tablet 1 mg by Gastric Tube route 3 (three) times daily.     loratadine (ALAVERT) 10 MG dissolvable tablet 10 mg by Gastric Tube route at bedtime.     Nebulizers MISC by Does not apply route as needed.       Allergies  Allergen Reactions  . Fish Allergy       The results of significant diagnostics from this hospitalization (including imaging, microbiology, ancillary and laboratory) are listed below for reference.    Significant Diagnostic Studies: US Renal  02/07/2015  CLINICAL DATA:  History of renal calculus and UTI EXAM: RENAL / URINARY TRACT ULTRASOUND COMPLETE COMPARISON:  02/06/2015 FINDINGS: Right Kidney: Length: 10.4 cm. Echogenicity within normal limits. No mass or hydronephrosis visualized. Left Kidney: Length: 9.6 cm. Echogenicity within normal limits. No mass or hydronephrosis visualized. Bladder: Decompressed IMPRESSION: No renal calculi or obstructive changes are noted. Electronically Signed   By: Alcide Clever M.D.   On: 02/07/2015 12:21   Dg Abd Acute W/chest  02/06/2015  CLINICAL DATA:  Code sepsis, cerebral palsy, urinary retention, holding lower abdomen, low grade fever EXAM: DG ABDOMEN ACUTE W/ 1V CHEST COMPARISON:  None FINDINGS: Spinal fixation rods thoracic and lumbar spine into sacrum. Normal heart size, mediastinal contours and pulmonary vascularity. Lungs clear. No pleural effusion or pneumothorax. Medication pump RIGHT  lower quadrant with intraspinal catheter. Gas and stool throughout mildly distended colon. Small bowel gas pattern normal. Overall bowel gas pattern is nonspecific without definite obstruction, wall thickening or free air. No acute osseous findings. Questionable tiny nonobstructing LEFT renal calculus. IMPRESSION: Clear lungs. Nonspecific bowel gas pattern. Cannot exclude tiny nonobstructing LEFT renal calculus. Electronically Signed   By: Ulyses Southward M.D.   On: 02/06/2015 17:28   Ct Renal Stone Study  02/06/2015  CLINICAL DATA:  Urinary retention and lower abdominal discomfort. Cerebral palsy. Low-grade fever. Possible left renal calculus on CT. EXAM: CT ABDOMEN AND PELVIS WITHOUT CONTRAST TECHNIQUE: Multidetector CT imaging of the abdomen and pelvis was performed following the standard protocol without IV contrast. COMPARISON:  Plain film of earlier today. FINDINGS: Lower chest: Clear lung bases. Normal heart size without pericardial or pleural effusion. Hepatobiliary: Moderate to marked degradation, secondary to combination of extensive posterior spinal hardware and anterior battery pack. Grossly normal liver. Gallbladder not well evaluated. Pancreas: Pancreas not well evaluated. Spleen: Grossly normal spleen. Adrenals/Urinary Tract: Right adrenal gland grossly normal. Left adrenal gland not well visualized. No dominant renal calculi or hydronephrosis. Ureters difficult to follow. No suspicious pelvic calcifications identified. The bladder is moderately distended, without cause identified. Stomach/Bowel: Grossly normal stomach. Large amount of stool and gas throughout the colon. The appendix is likely identified on image 50/series 2 and is normal. Small bowel not well evaluated. Vascular/Lymphatic: Abdominal aortic grossly normal in caliber. Limited evaluation for abdominal adenopathy. No pelvic sidewall adenopathy. Reproductive: Normal prostate. Other: No significant free fluid. Musculoskeletal: Thoracolumbar  spine fixation, without acute hardware complication. IMPRESSION: 1. Moderate to severely degraded exam, secondary to beam hardening artifact from spinal hardware and spinal stimulator pump/battery. 2. No gross urinary tract calculi or hydronephrosis. 3. Moderate bladder distention, of indeterminate etiology. 4. Large amount of colonic stool, suggesting constipation. Electronically Signed   By: Jeronimo Greaves M.D.   On: 02/06/2015 18:48    Microbiology: Recent Results (from the past 240 hour(s))  Blood Culture (routine x 2)  Status: None (Preliminary result)   Collection Time: 02/06/15  5:05 PM  Result Value Ref Range Status   Specimen Description BLOOD LEFT HAND  Final   Special Requests BOTTLES DRAWN AEROBIC ONLY 4CC  Final   Culture NO GROWTH < 24 HOURS  Final   Report Status PENDING  Incomplete  Blood Culture (routine x 2)     Status: None (Preliminary result)   Collection Time: 02/06/15  5:30 PM  Result Value Ref Range Status   Specimen Description BLOOD LEFT ANTECUBITAL DRAWN BY RN  Final   Special Requests   Final    BOTTLES DRAWN AEROBIC AND ANAEROBIC AEB=10CC ANA=6CC   Culture NO GROWTH < 24 HOURS  Final   Report Status PENDING  Incomplete     Labs: Basic Metabolic Panel:  Recent Labs Lab 02/06/15 1755 02/07/15 0758 02/08/15 0712  NA 141 137 141  K 3.8 4.0 4.5  CL 103 102 104  CO2 GLUCOSE 94 101* 94  BUN CREATININE 0.92 0.84 0.89  CALCIUM 9.6 9.2 9.5   Liver Function Tests:  Recent Labs Lab 02/06/15 1755  AST 19  ALT 14*  ALKPHOS 42  BILITOT 0.6  PROT 8.0  ALBUMIN 4.6   No results for input(s): LIPASE, AMYLASE in the last 168 hours. No results for input(s): AMMONIA in the last 168 hours. CBC:  Recent Labs Lab 02/06/15 1755 02/07/15 0758 02/08/15 0712  WBC 5.2 6.2 4.8  NEUTROABS 3.8  --   --   HGB 15.0 14.5 15.4  HCT 45.9 44.4 47.9  MCV 72.1* 71.7* 72.5*  PLT 190 181 207   Cardiac Enzymes: No results for input(s): CKTOTAL,  CKMB, CKMBINDEX, TROPONINI in the last 168 hours. BNP: BNP (last 3 results) No results for input(s): BNP in the last 8760 hours.  ProBNP (last 3 results) No results for input(s): PROBNP in the last 8760 hours.  CBG: No results for input(s): GLUCAP in the last 168 hours.     Signed:  Jeralyn Bennett MD.  [facility] 02/08/2015, 9:06 AM

## 2015-02-09 NOTE — Progress Notes (Signed)
Bladder scan showed 120cc in bladder at this time. Patient reported feeling the urge to urinate but hasn't urinated yet. Will continue to monitor.

## 2015-02-09 NOTE — Clinical Documentation Improvement (Addendum)
Hospitalist   (Query responses must be documented in the progress notes and discharge summary.)  "Contractures" is documented in the current medical record.  Please document the specific joints involved with contractures, including laterality.   Clinical Indicators/Information: "Contractures of lower extremities" documented by ED provider, Dr. Manus Gunning, 02/07/15  "Musculoskeletal: contractures of both lower ext" documented by Dr. Jomarie Longs 02/07/15   Please exercise your independent, professional judgment when responding. A specific answer is not anticipated or expected.   Thank You, Jerral Ralph  RN BSN CCDS [phone] Health Information Management [place]

## 2015-02-12 LAB — CULTURE, BLOOD (ROUTINE X 2)
CULTURE: NO GROWTH
Culture: NO GROWTH

## 2015-02-13 NOTE — Progress Notes (Signed)
AVS reviewed with patient's caregiver.  Verbalized understanding of discharge instructions, physician follow-up with [facility], and medications.  Patient's IV removed.  Site WNL.  Patient transported by NT via w/c to main entrance for discharge.  Patient transported home in family's private handicap Zenaida Niece.  Patient stable at time of discharge.

## 2016-08-09 IMAGING — CT CT RENAL STONE PROTOCOL
3 of 4 series · 7 of 46 positions shown, 13 images · non-contrast
Comparison: Plain film of earlier today.

CLINICAL DATA: Urinary retention and lower abdominal discomfort.
Cerebral palsy. Low-grade fever. Possible left renal calculus on CT.

EXAM:
CT ABDOMEN AND PELVIS WITHOUT CONTRAST
TECHNIQUE: Multidetector CT imaging of the abdomen and pelvis was performed
following the standard protocol without IV contrast.

[Series 3: lung 5.0 b60f · axial · 0.59mm/px · z∈[-66,-26]mm · 3 of 16 slices shown, 7 images]
[im 4/16  soft-tissue]
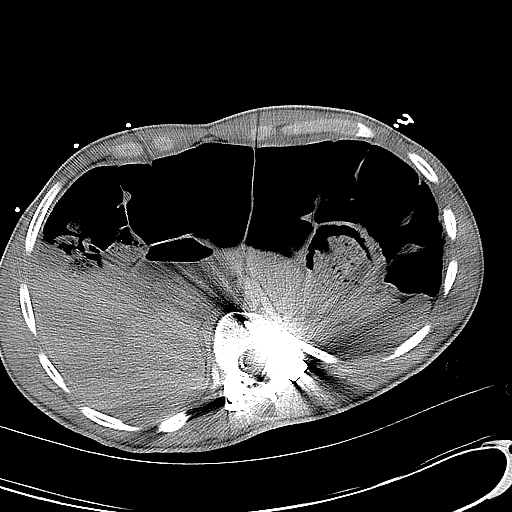
[im 4/16  lung]
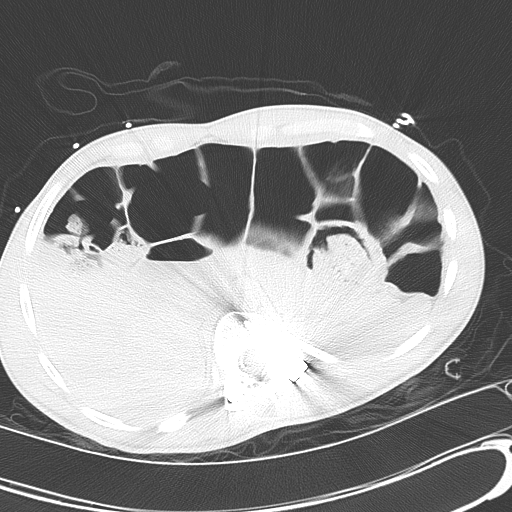
[im 4/16  bone]
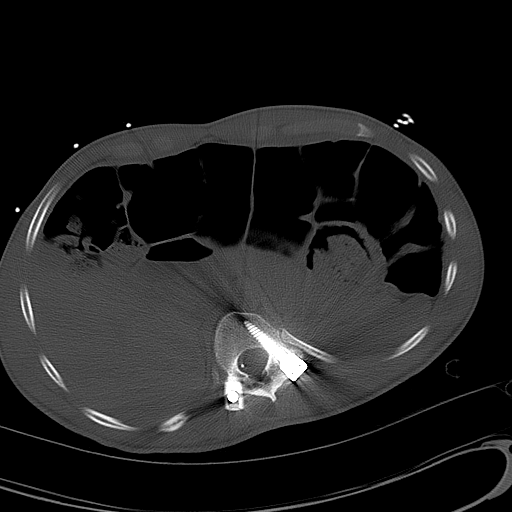
[im 8/16  soft-tissue]
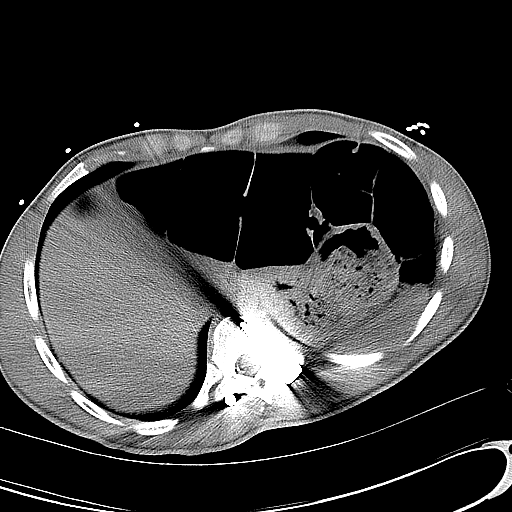
[im 8/16  lung]
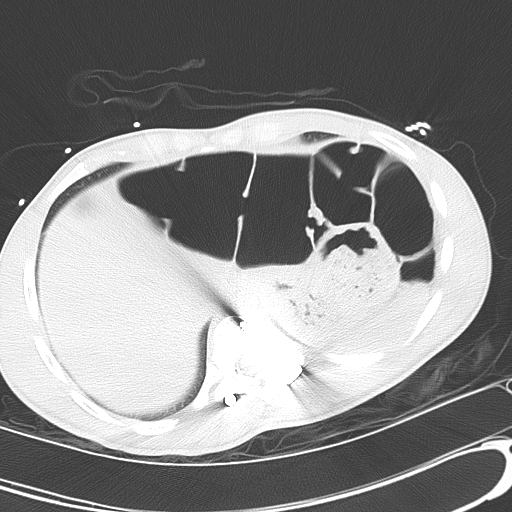
[im 12/16  soft-tissue]
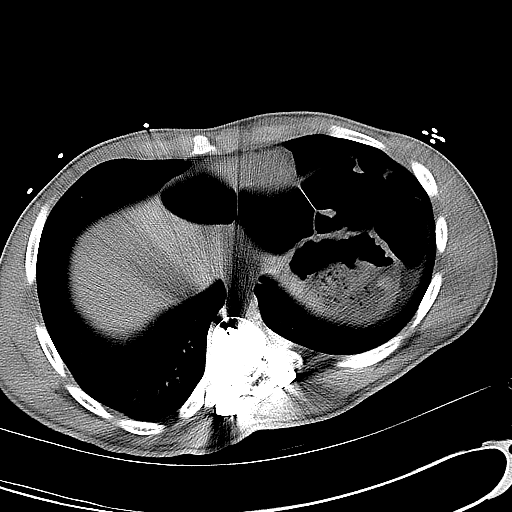
[im 12/16  lung]
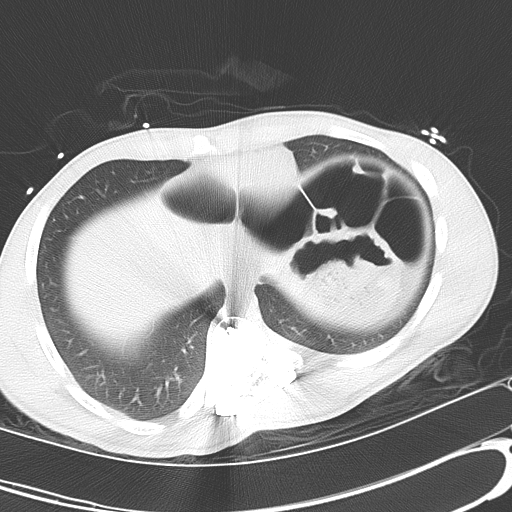

[Series 4: mpr coronal 3.0mm · coronal · 0.68mm/px · 3 of 68 slices shown, 4 images]
[im 23/68  soft-tissue]
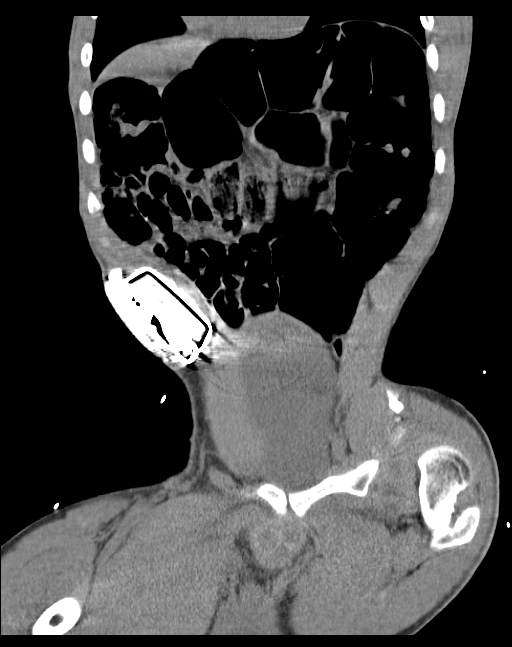
[im 30/68  soft-tissue]
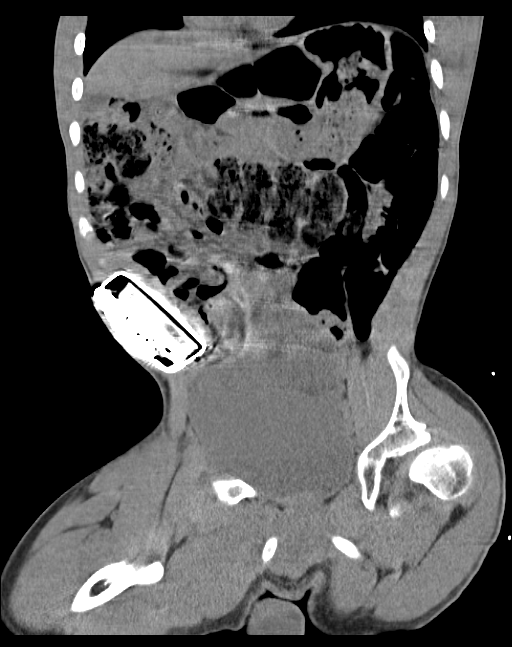
[im 30/68  bone]
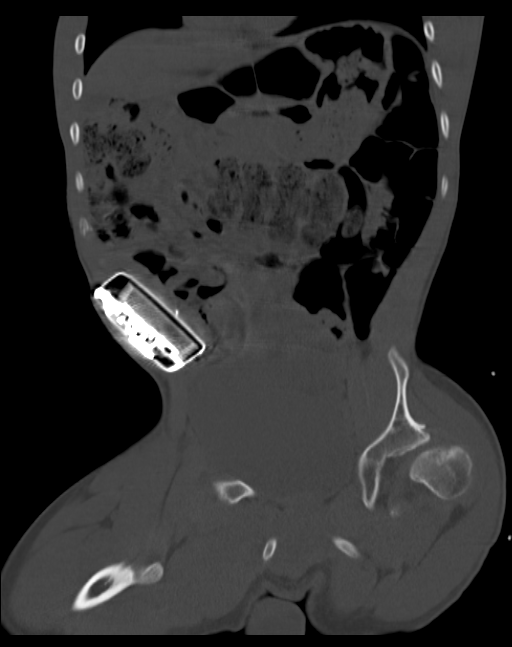
[im 38/68  soft-tissue]
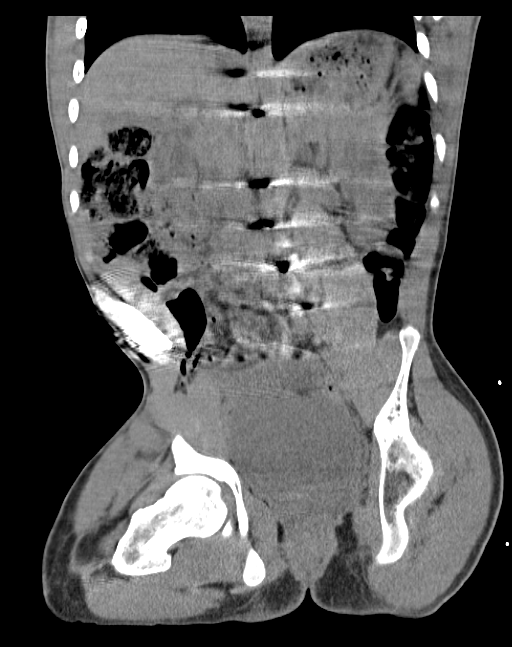

[Series 5: mpr sagittal 3.0mm · sagittal · 0.44mm/px · 1 of 99 slices shown, 2 images]
[im 33/99  soft-tissue]
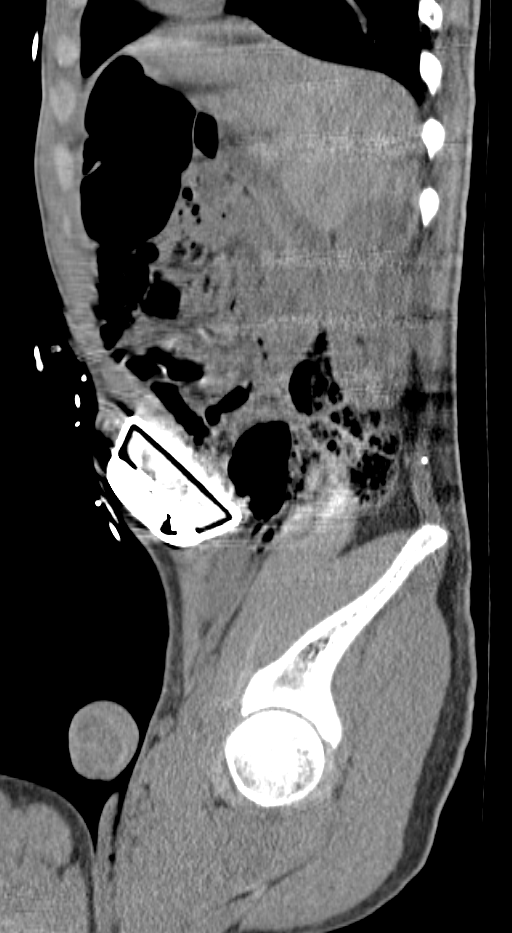
[im 33/99  bone]
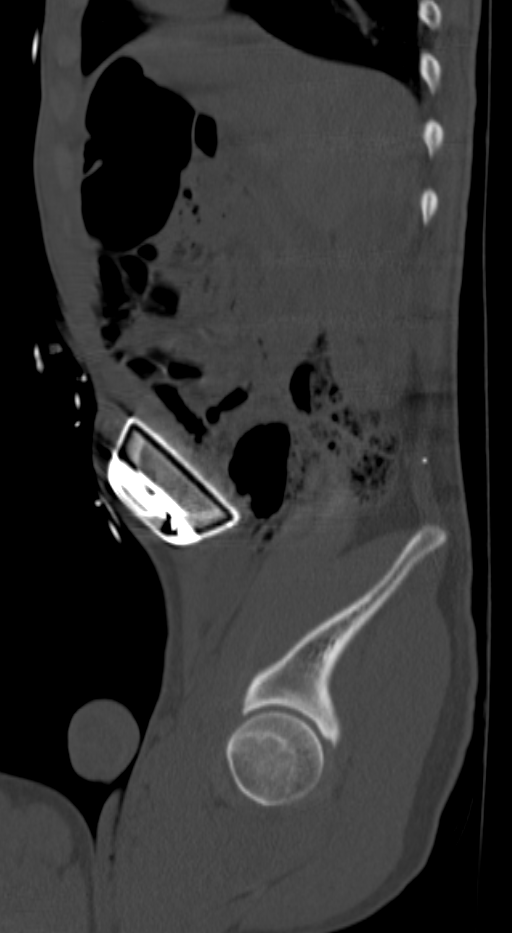

[7 of 46 positions shown; findings below may reference images not displayed]

FINDINGS: Lower chest: Clear lung bases. Normal heart size without pericardial
or pleural effusion.

Hepatobiliary: Moderate to marked degradation, secondary to
combination of extensive posterior spinal hardware and anterior
battery pack. Grossly normal liver. Gallbladder not well evaluated.

Pancreas: Pancreas not well evaluated.

Spleen: Grossly normal spleen.

Adrenals/Urinary Tract: Right adrenal gland grossly normal. Left
adrenal gland not well visualized. No dominant renal calculi or
hydronephrosis. Ureters difficult to follow. No suspicious pelvic
calcifications identified. The bladder is moderately distended,
without cause identified.

Stomach/Bowel: Grossly normal stomach. Large amount of stool and gas
throughout the colon. The appendix is likely identified on image
50/series 2 and is normal. Small bowel not well evaluated.

Vascular/Lymphatic: Abdominal aortic grossly normal in caliber.
Limited evaluation for abdominal adenopathy. No pelvic sidewall
adenopathy.

Reproductive: Normal prostate.

Other: No significant free fluid.

Musculoskeletal: Thoracolumbar spine fixation, without acute
hardware complication.
IMPRESSION: 1. Moderate to severely degraded exam, secondary to beam hardening
artifact from spinal hardware and spinal stimulator pump/battery.
2. No gross urinary tract calculi or hydronephrosis.
3. Moderate bladder distention, of indeterminate etiology.
4. Large amount of colonic stool, suggesting constipation.

## 2016-08-09 IMAGING — DX DG ABDOMEN ACUTE W/ 1V CHEST
3 series · 3 of 3 positions shown · non-contrast
Comparison: None

CLINICAL DATA: Code sepsis, cerebral palsy, urinary retention,
holding lower abdomen, low grade fever

EXAM:
DG ABDOMEN ACUTE W/ 1V CHEST

[chest pa]
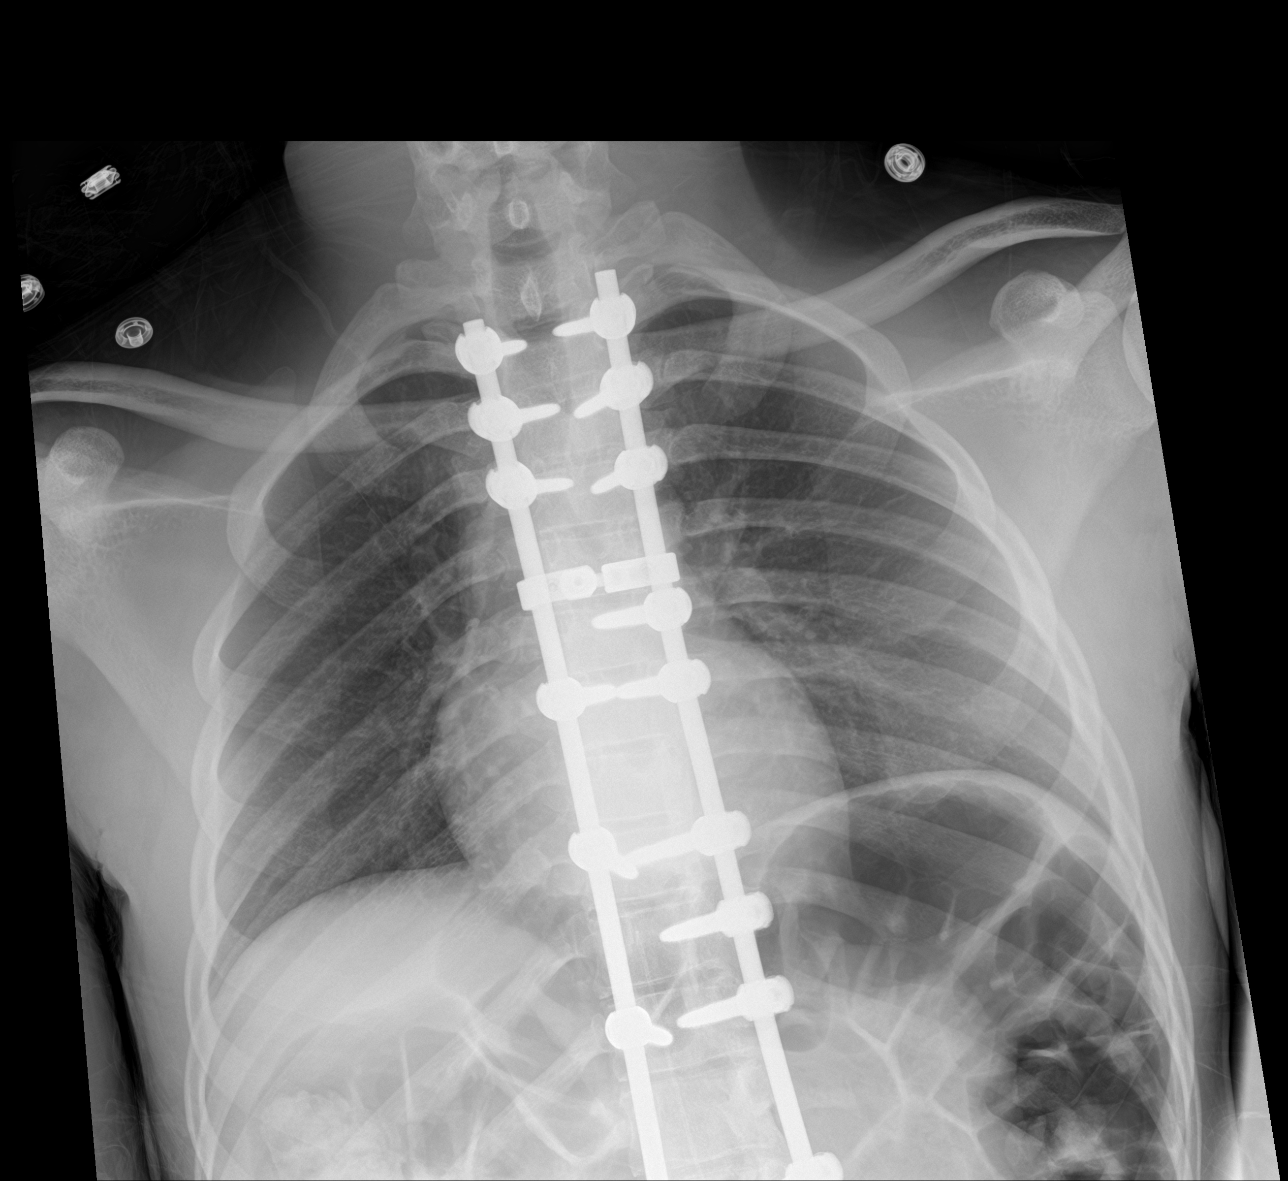

[abdomen erect]
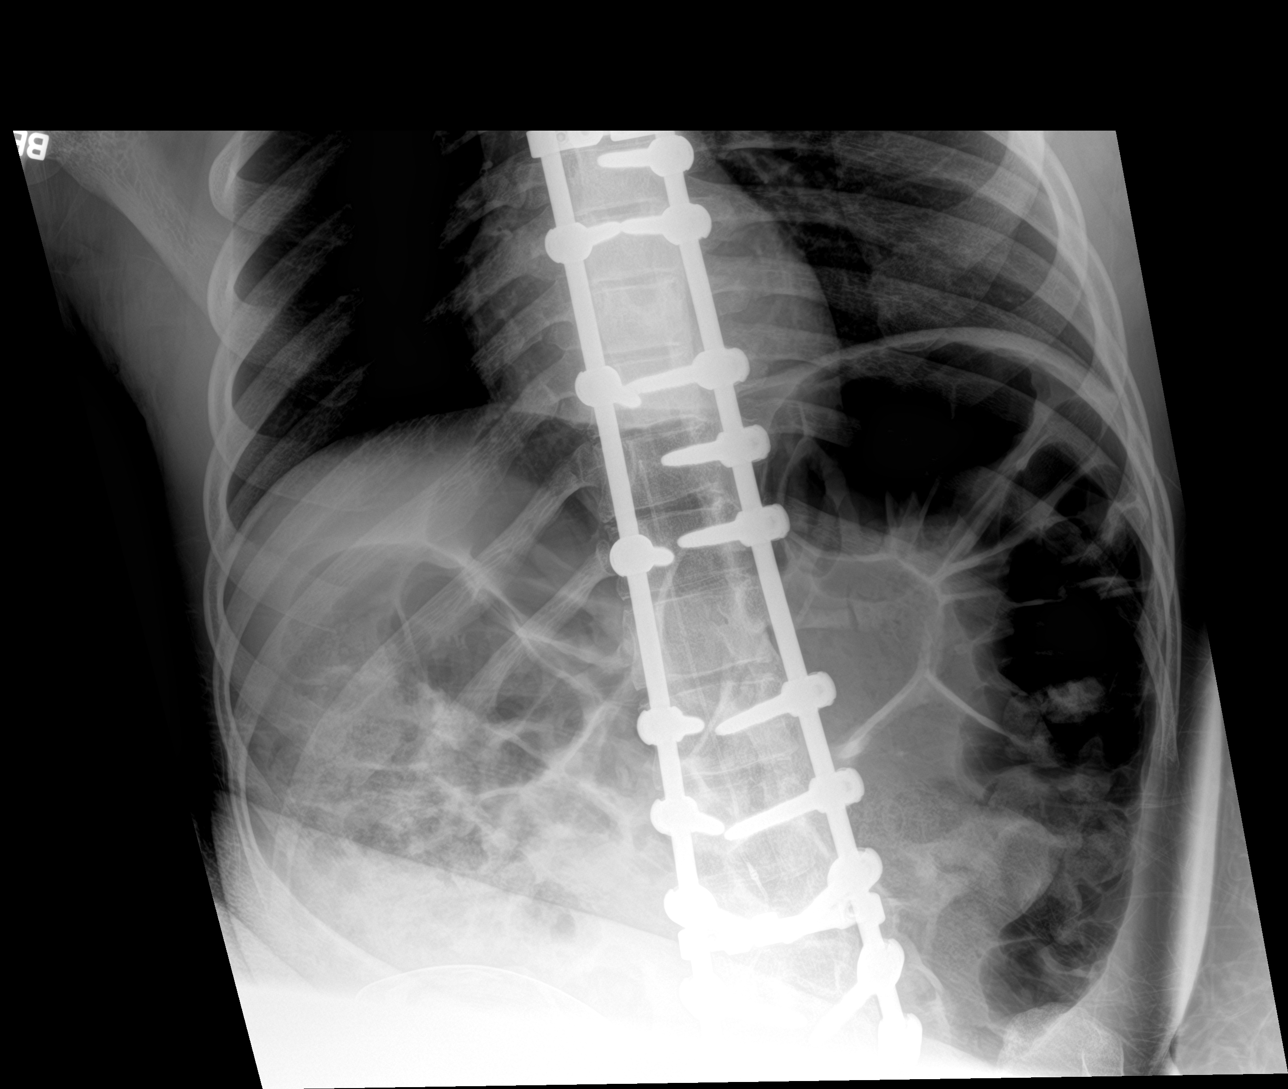

[abdomen supine]
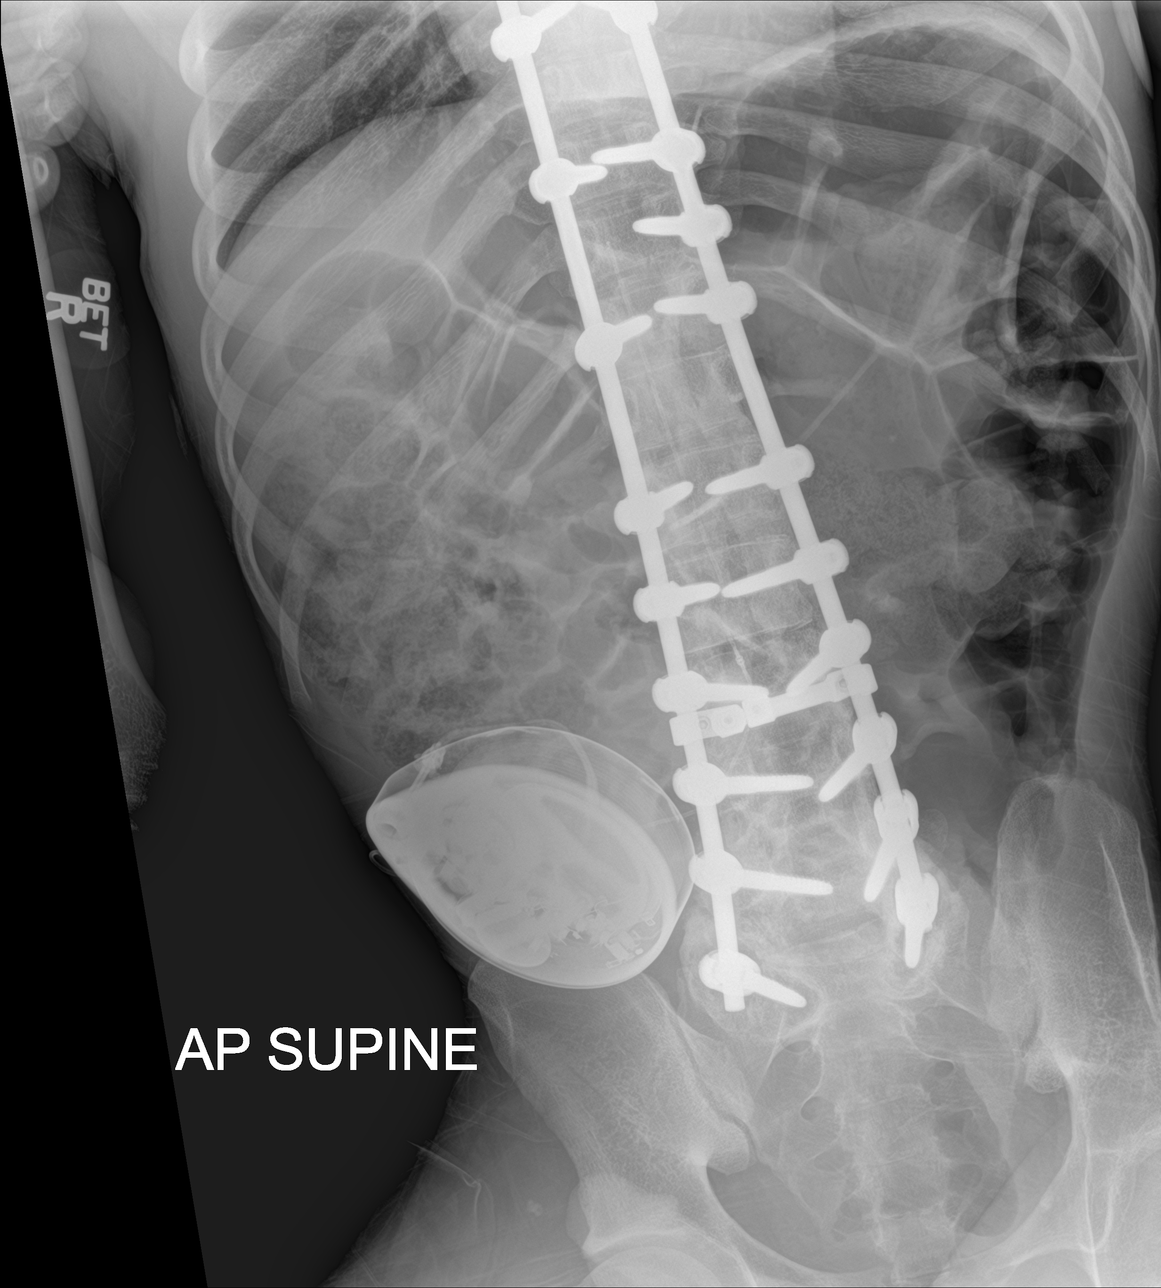

[3 of 3 positions shown; findings below may reference images not displayed]

FINDINGS: Spinal fixation rods thoracic and lumbar spine into sacrum.

Normal heart size, mediastinal contours and pulmonary vascularity.

Lungs clear.

No pleural effusion or pneumothorax.

Medication pump RIGHT lower quadrant with intraspinal catheter.

Gas and stool throughout mildly distended colon.

Small bowel gas pattern normal.

Overall bowel gas pattern is nonspecific without definite
obstruction, wall thickening or free air.

No acute osseous findings.

Questionable tiny nonobstructing LEFT renal calculus.
IMPRESSION: Clear lungs.

Nonspecific bowel gas pattern.

Cannot exclude tiny nonobstructing LEFT renal calculus.

## 2016-11-08 IMAGING — US US RENAL
1 series · 14 of 25 positions shown · non-contrast
Comparison: 02/06/2015

CLINICAL DATA: History of renal calculus and UTI

EXAM:
RENAL / URINARY TRACT ULTRASOUND COMPLETE

[Series 1: us renal · 0.21mm/px · 14 of 40 slices shown]
[im 1/40]
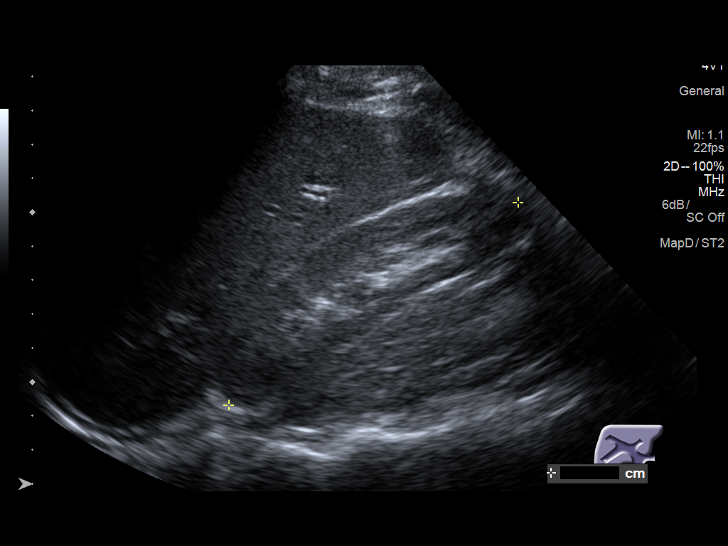
[im 4/40]
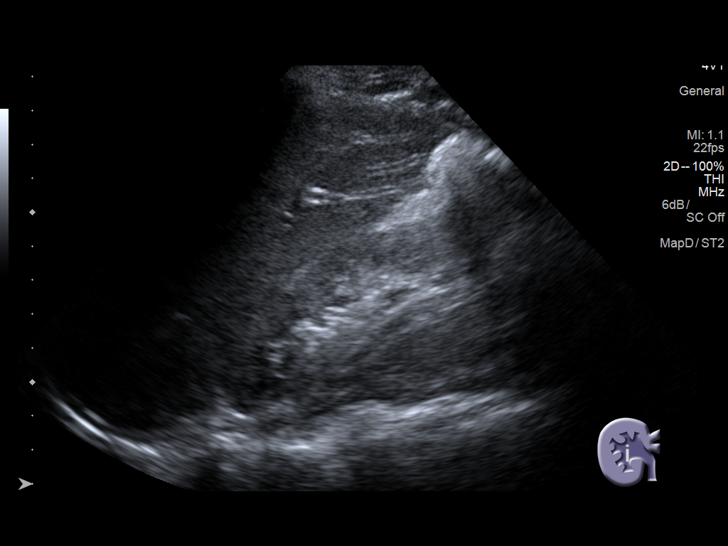
[im 7/40]
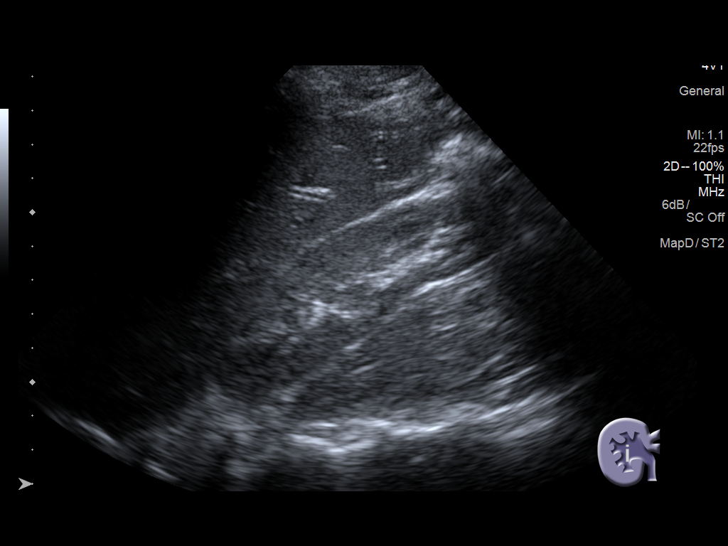
[im 10/40]
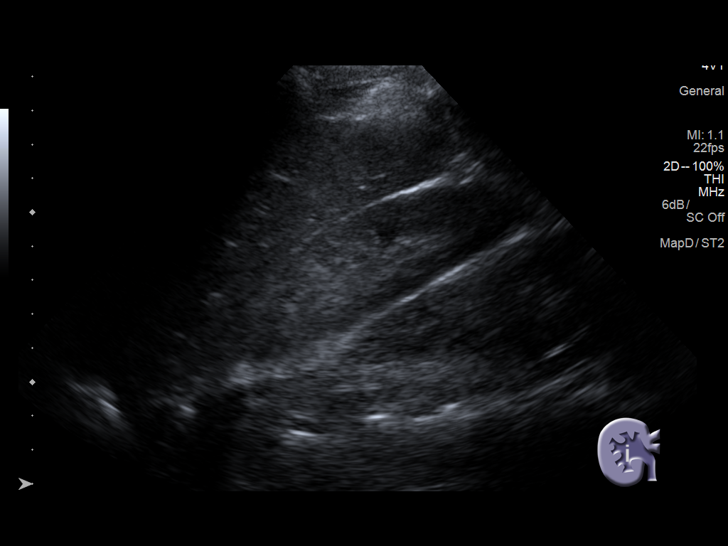
[im 14/40]
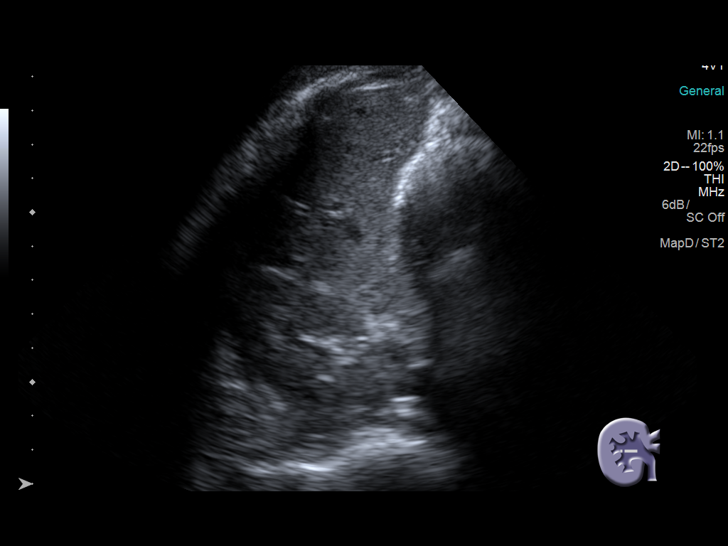
[im 15/40]
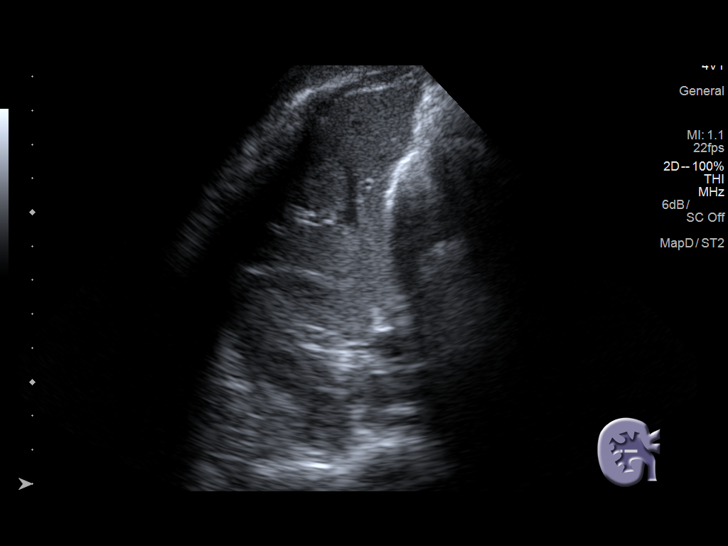
[im 18/40]
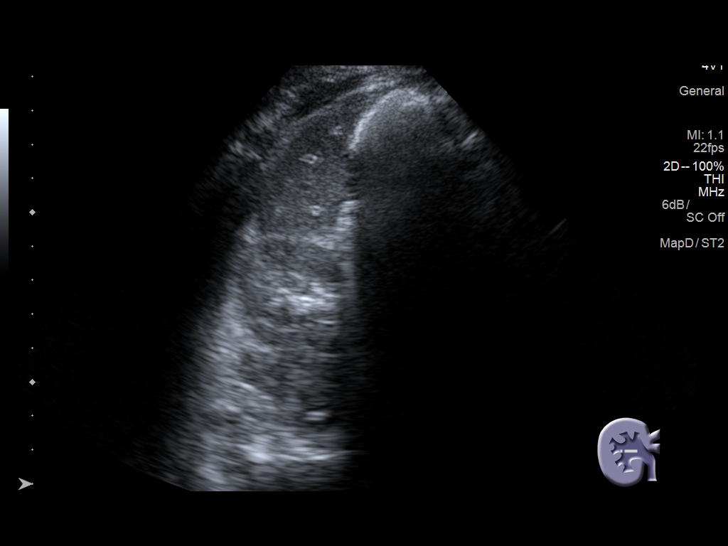
[im 22/40]
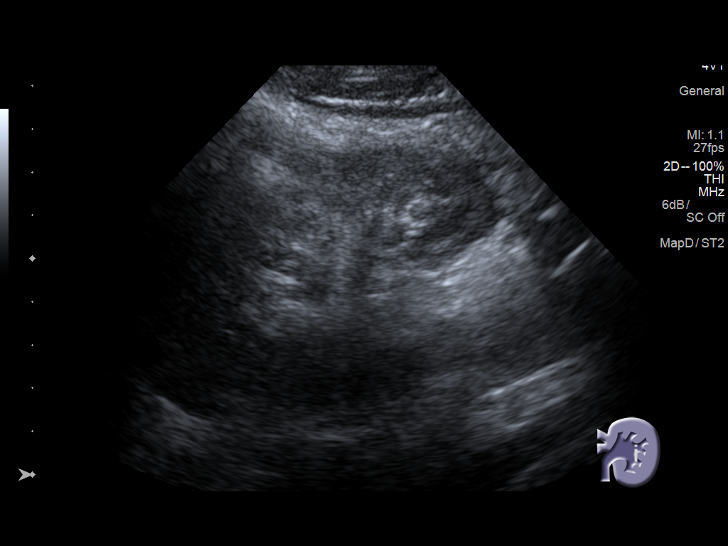
[im 25/40]
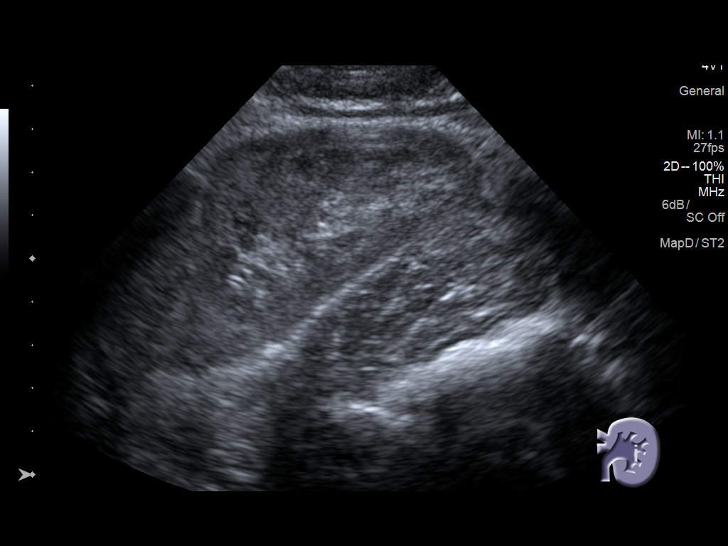
[im 27/40]
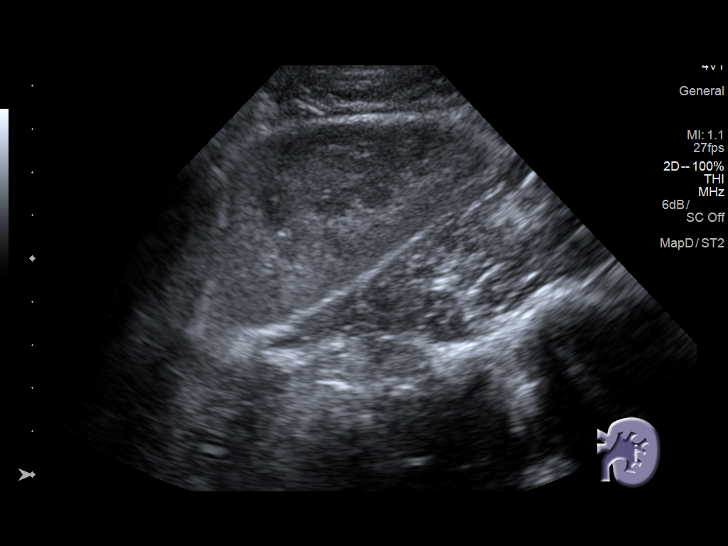
[im 30/40]
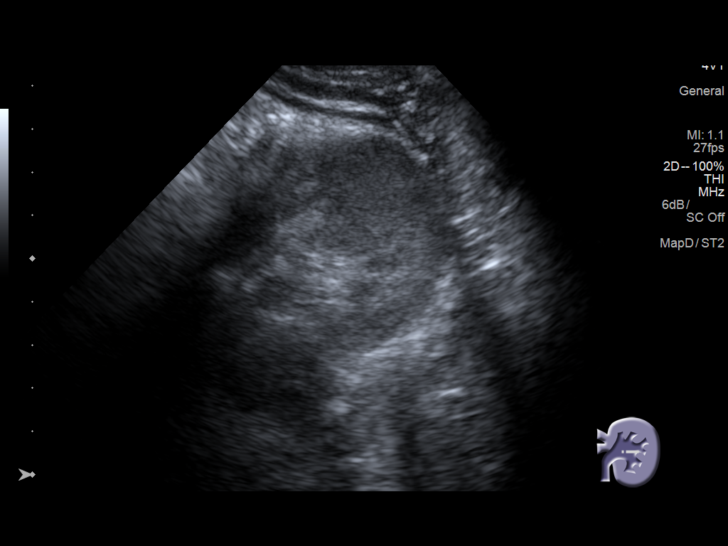
[im 33/40]
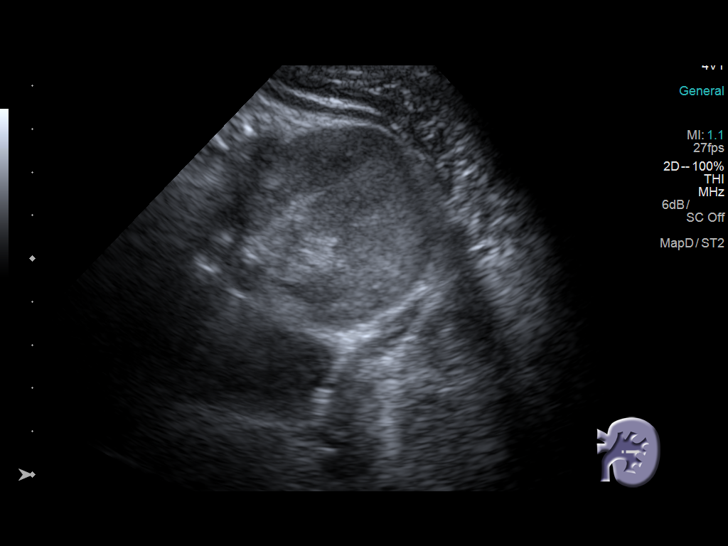
[im 36/40]
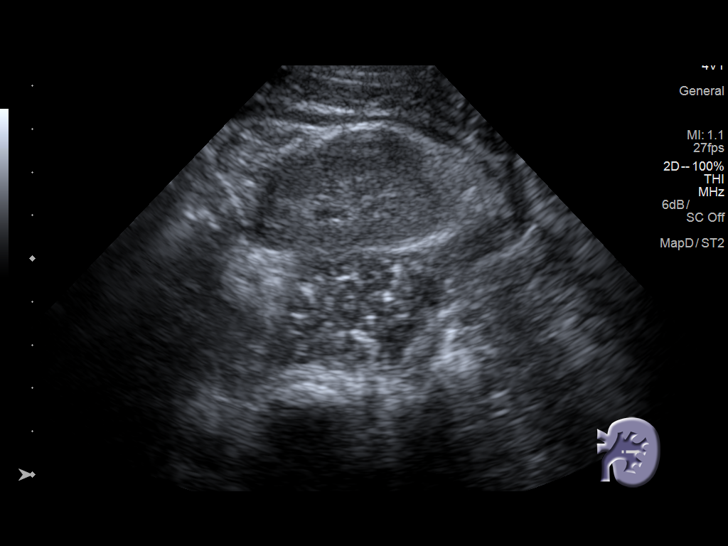
[im 40/40]
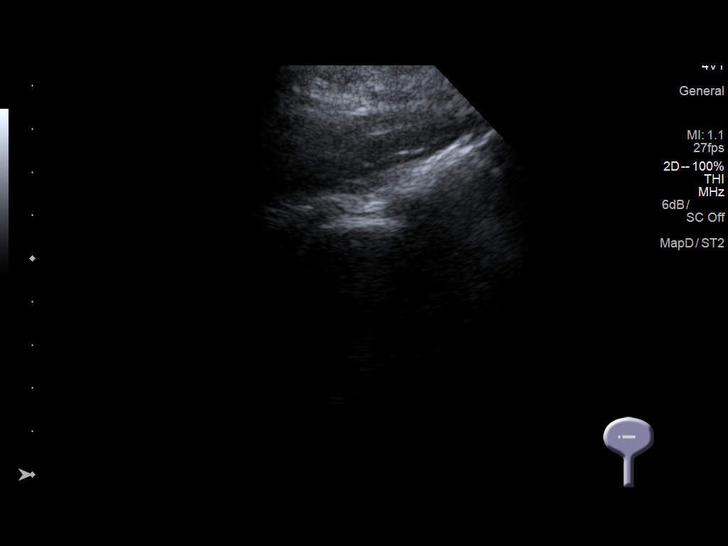

[14 of 25 positions shown; findings below may reference images not displayed]

FINDINGS: Right Kidney:

Length: 10.4 cm.. Echogenicity within normal limits. No mass or
hydronephrosis visualized.

Left Kidney:

Length: 9.6 cm.. Echogenicity within normal limits. No mass or
hydronephrosis visualized.

Bladder:

Decompressed
IMPRESSION: No renal calculi or obstructive changes are noted.
